# Patient Record
Sex: Female | Born: 1935 | Race: White | Hispanic: No | Marital: Married | Smoking: Never smoker
Health system: Southern US, Community
[De-identification: ages and names within clinical notes are randomized; demographics above are authoritative.]

## PROBLEM LIST (undated history)

## (undated) DIAGNOSIS — F329 Major depressive disorder, single episode, unspecified: Secondary | ICD-10-CM

## (undated) DIAGNOSIS — I480 Paroxysmal atrial fibrillation: Secondary | ICD-10-CM

## (undated) DIAGNOSIS — Z6821 Body mass index (BMI) 21.0-21.9, adult: Secondary | ICD-10-CM

## (undated) DIAGNOSIS — G988 Other disorders of nervous system: Secondary | ICD-10-CM

## (undated) DIAGNOSIS — M5136 Other intervertebral disc degeneration, lumbar region: Secondary | ICD-10-CM

## (undated) DIAGNOSIS — D509 Iron deficiency anemia, unspecified: Secondary | ICD-10-CM

## (undated) DIAGNOSIS — E039 Hypothyroidism, unspecified: Secondary | ICD-10-CM

## (undated) DIAGNOSIS — R262 Difficulty in walking, not elsewhere classified: Secondary | ICD-10-CM

## (undated) DIAGNOSIS — I029 Rheumatic chorea without heart involvement: Secondary | ICD-10-CM

## (undated) DIAGNOSIS — E538 Deficiency of other specified B group vitamins: Secondary | ICD-10-CM

## (undated) DIAGNOSIS — I5030 Unspecified diastolic (congestive) heart failure: Secondary | ICD-10-CM

## (undated) DIAGNOSIS — F418 Other specified anxiety disorders: Secondary | ICD-10-CM

## (undated) DIAGNOSIS — I619 Nontraumatic intracerebral hemorrhage, unspecified: Secondary | ICD-10-CM

## (undated) DIAGNOSIS — R0602 Shortness of breath: Secondary | ICD-10-CM

## (undated) DIAGNOSIS — K5730 Diverticulosis of large intestine without perforation or abscess without bleeding: Secondary | ICD-10-CM

## (undated) DIAGNOSIS — G43909 Migraine, unspecified, not intractable, without status migrainosus: Secondary | ICD-10-CM

## (undated) DIAGNOSIS — K219 Gastro-esophageal reflux disease without esophagitis: Secondary | ICD-10-CM

## (undated) DIAGNOSIS — M159 Polyosteoarthritis, unspecified: Secondary | ICD-10-CM

## (undated) DIAGNOSIS — K2960 Other gastritis without bleeding: Secondary | ICD-10-CM

## (undated) DIAGNOSIS — M5410 Radiculopathy, site unspecified: Secondary | ICD-10-CM

## (undated) DIAGNOSIS — R531 Weakness: Secondary | ICD-10-CM

## (undated) DIAGNOSIS — R296 Repeated falls: Secondary | ICD-10-CM

## (undated) DIAGNOSIS — R634 Abnormal weight loss: Secondary | ICD-10-CM

## (undated) DIAGNOSIS — I739 Peripheral vascular disease, unspecified: Secondary | ICD-10-CM

## (undated) DIAGNOSIS — K648 Other hemorrhoids: Secondary | ICD-10-CM

## (undated) DIAGNOSIS — R32 Unspecified urinary incontinence: Secondary | ICD-10-CM

## (undated) DIAGNOSIS — E559 Vitamin D deficiency, unspecified: Secondary | ICD-10-CM

## (undated) DIAGNOSIS — G4700 Insomnia, unspecified: Secondary | ICD-10-CM

## (undated) DIAGNOSIS — I651 Occlusion and stenosis of basilar artery: Secondary | ICD-10-CM

## (undated) DIAGNOSIS — F0150 Vascular dementia without behavioral disturbance: Secondary | ICD-10-CM

## (undated) DIAGNOSIS — F411 Generalized anxiety disorder: Secondary | ICD-10-CM

## (undated) DIAGNOSIS — K5909 Other constipation: Secondary | ICD-10-CM

## (undated) DIAGNOSIS — R1310 Dysphagia, unspecified: Secondary | ICD-10-CM

## (undated) DIAGNOSIS — H6120 Impacted cerumen, unspecified ear: Secondary | ICD-10-CM

## (undated) DIAGNOSIS — K449 Diaphragmatic hernia without obstruction or gangrene: Secondary | ICD-10-CM

## (undated) DIAGNOSIS — J45909 Unspecified asthma, uncomplicated: Secondary | ICD-10-CM

## (undated) DIAGNOSIS — J449 Chronic obstructive pulmonary disease, unspecified: Secondary | ICD-10-CM

## (undated) DIAGNOSIS — R110 Nausea: Secondary | ICD-10-CM

## (undated) DIAGNOSIS — E785 Hyperlipidemia, unspecified: Secondary | ICD-10-CM

## (undated) DIAGNOSIS — N390 Urinary tract infection, site not specified: Secondary | ICD-10-CM

## (undated) DIAGNOSIS — I4891 Unspecified atrial fibrillation: Secondary | ICD-10-CM

## (undated) DIAGNOSIS — M329 Systemic lupus erythematosus, unspecified: Secondary | ICD-10-CM

## (undated) DIAGNOSIS — K644 Residual hemorrhoidal skin tags: Secondary | ICD-10-CM

## (undated) DIAGNOSIS — J189 Pneumonia, unspecified organism: Secondary | ICD-10-CM

## (undated) HISTORY — DX: Chronic obstructive pulmonary disease, unspecified: J44.9

## (undated) HISTORY — DX: Other hemorrhoids: K64.8

## (undated) HISTORY — DX: Major depressive disorder, single episode, unspecified: F32.9

## (undated) HISTORY — DX: Unspecified asthma, uncomplicated: J45.909

## (undated) HISTORY — DX: Hyperlipidemia, unspecified: E78.5

## (undated) HISTORY — DX: Paroxysmal atrial fibrillation: I48.0

## (undated) HISTORY — DX: Occlusion and stenosis of basilar artery: I65.1

## (undated) HISTORY — DX: Impacted cerumen, unspecified ear: H61.20

## (undated) HISTORY — DX: Difficulty in walking, not elsewhere classified: R26.2

## (undated) HISTORY — DX: Diaphragmatic hernia without obstruction or gangrene: K44.9

## (undated) HISTORY — DX: Other gastritis without bleeding: K29.60

## (undated) HISTORY — DX: Gastro-esophageal reflux disease without esophagitis: K21.9

## (undated) HISTORY — DX: Nontraumatic intracerebral hemorrhage, unspecified: I61.9

## (undated) HISTORY — DX: Systemic lupus erythematosus, unspecified: M32.9

## (undated) HISTORY — DX: Unspecified atrial fibrillation: I48.91

## (undated) HISTORY — PX: APPENDECTOMY: SHX54

## (undated) HISTORY — DX: Hypothyroidism, unspecified: E03.9

## (undated) HISTORY — DX: Generalized anxiety disorder: F41.1

## (undated) HISTORY — DX: Nausea: R11.0

## (undated) HISTORY — DX: Residual hemorrhoidal skin tags: K64.4

## (undated) HISTORY — DX: Repeated falls: R29.6

## (undated) HISTORY — PX: ABDOMINAL HYSTERECTOMY: SHX81

## (undated) HISTORY — DX: Deficiency of other specified B group vitamins: E53.8

## (undated) HISTORY — DX: Unspecified urinary incontinence: R32

## (undated) HISTORY — DX: Vitamin D deficiency, unspecified: E55.9

## (undated) HISTORY — DX: Other intervertebral disc degeneration, lumbar region: M51.36

## (undated) HISTORY — DX: Other specified anxiety disorders: F41.8

## (undated) HISTORY — DX: Other disorders of nervous system: G98.8

## (undated) HISTORY — DX: Urinary tract infection, site not specified: N39.0

## (undated) HISTORY — DX: Unspecified diastolic (congestive) heart failure: I50.30

## (undated) HISTORY — DX: Peripheral vascular disease, unspecified: I73.9

## (undated) HISTORY — DX: Diverticulosis of large intestine without perforation or abscess without bleeding: K57.30

## (undated) HISTORY — DX: Insomnia, unspecified: G47.00

## (undated) HISTORY — DX: Migraine, unspecified, not intractable, without status migrainosus: G43.909

## (undated) HISTORY — DX: Dysphagia, unspecified: R13.10

## (undated) HISTORY — DX: Shortness of breath: R06.02

## (undated) HISTORY — DX: Pneumonia, unspecified organism: J18.9

## (undated) HISTORY — DX: Iron deficiency anemia, unspecified: D50.9

## (undated) HISTORY — DX: Body mass index (BMI) 21.0-21.9, adult: Z68.21

## (undated) HISTORY — DX: Other constipation: K59.09

## (undated) HISTORY — PX: BACK SURGERY: SHX140

## (undated) HISTORY — DX: Rheumatic chorea without heart involvement: I02.9

## (undated) HISTORY — DX: Radiculopathy, site unspecified: M54.10

## (undated) HISTORY — DX: Weakness: R53.1

## (undated) HISTORY — DX: Vascular dementia, unspecified severity, without behavioral disturbance, psychotic disturbance, mood disturbance, and anxiety: F01.50

## (undated) HISTORY — DX: Abnormal weight loss: R63.4

## (undated) HISTORY — DX: Polyosteoarthritis, unspecified: M15.9

---

## 2000-10-15 ENCOUNTER — Ambulatory Visit (HOSPITAL_COMMUNITY): Admission: RE | Admit: 2000-10-15 | Discharge: 2000-10-15 | Payer: Self-pay | Admitting: Specialist

## 2015-12-30 DIAGNOSIS — G44021 Chronic cluster headache, intractable: Secondary | ICD-10-CM | POA: Insufficient documentation

## 2015-12-30 HISTORY — DX: Chronic cluster headache, intractable: G44.021

## 2016-01-27 DIAGNOSIS — Z09 Encounter for follow-up examination after completed treatment for conditions other than malignant neoplasm: Secondary | ICD-10-CM

## 2016-01-27 HISTORY — DX: Encounter for follow-up examination after completed treatment for conditions other than malignant neoplasm: Z09

## 2017-04-14 DIAGNOSIS — I509 Heart failure, unspecified: Secondary | ICD-10-CM | POA: Diagnosis not present

## 2017-04-14 DIAGNOSIS — R51 Headache: Secondary | ICD-10-CM | POA: Diagnosis not present

## 2017-04-14 DIAGNOSIS — J811 Chronic pulmonary edema: Secondary | ICD-10-CM

## 2017-04-14 DIAGNOSIS — I160 Hypertensive urgency: Secondary | ICD-10-CM | POA: Diagnosis not present

## 2017-04-14 DIAGNOSIS — R443 Hallucinations, unspecified: Secondary | ICD-10-CM

## 2017-04-15 DIAGNOSIS — I609 Nontraumatic subarachnoid hemorrhage, unspecified: Secondary | ICD-10-CM

## 2017-04-15 DIAGNOSIS — R443 Hallucinations, unspecified: Secondary | ICD-10-CM | POA: Diagnosis not present

## 2017-04-15 DIAGNOSIS — R51 Headache: Secondary | ICD-10-CM | POA: Diagnosis not present

## 2017-04-15 DIAGNOSIS — J811 Chronic pulmonary edema: Secondary | ICD-10-CM | POA: Diagnosis not present

## 2017-04-15 DIAGNOSIS — I509 Heart failure, unspecified: Secondary | ICD-10-CM | POA: Diagnosis not present

## 2017-04-15 HISTORY — DX: Nontraumatic subarachnoid hemorrhage, unspecified: I60.9

## 2019-01-09 ENCOUNTER — Other Ambulatory Visit (HOSPITAL_COMMUNITY): Payer: Self-pay | Admitting: Interventional Radiology

## 2019-01-09 DIAGNOSIS — I739 Peripheral vascular disease, unspecified: Secondary | ICD-10-CM

## 2019-01-14 ENCOUNTER — Other Ambulatory Visit: Payer: Self-pay | Admitting: Student

## 2019-01-15 ENCOUNTER — Other Ambulatory Visit (HOSPITAL_COMMUNITY): Payer: Self-pay | Admitting: Interventional Radiology

## 2019-01-15 ENCOUNTER — Other Ambulatory Visit: Payer: Self-pay

## 2019-01-15 ENCOUNTER — Ambulatory Visit (HOSPITAL_COMMUNITY)
Admission: RE | Admit: 2019-01-15 | Discharge: 2019-01-15 | Disposition: A | Discharge status: Home / Self Care | Payer: Medicare (Managed Care) | Source: Ambulatory Visit | Attending: Interventional Radiology | Admitting: Interventional Radiology

## 2019-01-15 ENCOUNTER — Encounter (HOSPITAL_COMMUNITY): Payer: Self-pay

## 2019-01-15 DIAGNOSIS — I509 Heart failure, unspecified: Secondary | ICD-10-CM | POA: Insufficient documentation

## 2019-01-15 DIAGNOSIS — I130 Hypertensive heart and chronic kidney disease with heart failure and stage 1 through stage 4 chronic kidney disease, or unspecified chronic kidney disease: Secondary | ICD-10-CM | POA: Insufficient documentation

## 2019-01-15 DIAGNOSIS — I739 Peripheral vascular disease, unspecified: Secondary | ICD-10-CM

## 2019-01-15 DIAGNOSIS — M329 Systemic lupus erythematosus, unspecified: Secondary | ICD-10-CM | POA: Diagnosis not present

## 2019-01-15 DIAGNOSIS — J449 Chronic obstructive pulmonary disease, unspecified: Secondary | ICD-10-CM | POA: Diagnosis not present

## 2019-01-15 DIAGNOSIS — I745 Embolism and thrombosis of iliac artery: Secondary | ICD-10-CM | POA: Insufficient documentation

## 2019-01-15 DIAGNOSIS — I70221 Atherosclerosis of native arteries of extremities with rest pain, right leg: Secondary | ICD-10-CM | POA: Insufficient documentation

## 2019-01-15 DIAGNOSIS — I2510 Atherosclerotic heart disease of native coronary artery without angina pectoris: Secondary | ICD-10-CM | POA: Insufficient documentation

## 2019-01-15 DIAGNOSIS — I480 Paroxysmal atrial fibrillation: Secondary | ICD-10-CM | POA: Insufficient documentation

## 2019-01-15 DIAGNOSIS — N183 Chronic kidney disease, stage 3 (moderate): Secondary | ICD-10-CM | POA: Insufficient documentation

## 2019-01-15 DIAGNOSIS — Z8673 Personal history of transient ischemic attack (TIA), and cerebral infarction without residual deficits: Secondary | ICD-10-CM | POA: Diagnosis not present

## 2019-01-15 HISTORY — PX: IR ANGIOGRAM EXTREMITY BILATERAL: IMG653

## 2019-01-15 HISTORY — PX: IR ILIAC ART STENT INC PTA MOD SED: IMG2306

## 2019-01-15 HISTORY — PX: IR US GUIDE VASC ACCESS LEFT: IMG2389

## 2019-01-15 HISTORY — PX: IR US GUIDE VASC ACCESS RIGHT: IMG2390

## 2019-01-15 HISTORY — PX: IR AORTAGRAM ABDOMINAL SERIALOGRAM: IMG636

## 2019-01-15 HISTORY — PX: IR TRANSCATH PLC STENT 1ST ART NOT LE CV CAR VERT CAR: IMG5443

## 2019-01-15 LAB — CBC
HCT: 34.2 % — ABNORMAL LOW (ref 36.0–46.0)
Hemoglobin: 10.7 g/dL — ABNORMAL LOW (ref 12.0–15.0)
MCH: 27.6 pg (ref 26.0–34.0)
MCHC: 31.3 g/dL (ref 30.0–36.0)
MCV: 88.1 fL (ref 80.0–100.0)
Platelets: 156 10*3/uL (ref 150–400)
RBC: 3.88 MIL/uL (ref 3.87–5.11)
RDW: 12 % (ref 11.5–15.5)
WBC: 4 10*3/uL (ref 4.0–10.5)
nRBC: 0 % (ref 0.0–0.2)

## 2019-01-15 LAB — POCT I-STAT 4, (NA,K, GLUC, HGB,HCT)
Glucose, Bld: 90 mg/dL (ref 70–99)
HCT: 31 % — ABNORMAL LOW (ref 36.0–46.0)
Hemoglobin: 10.5 g/dL — ABNORMAL LOW (ref 12.0–15.0)
Potassium: 4 mmol/L (ref 3.5–5.1)
Sodium: 135 mmol/L (ref 135–145)

## 2019-01-15 LAB — APTT: aPTT: 29 seconds (ref 24–36)

## 2019-01-15 LAB — PROTIME-INR
INR: 1 (ref 0.8–1.2)
Prothrombin Time: 13.3 seconds (ref 11.4–15.2)

## 2019-01-15 LAB — POCT I-STAT CREATININE: CREATININE: 1 mg/dL (ref 0.44–1.00)

## 2019-01-15 MED ORDER — CLOPIDOGREL BISULFATE 75 MG PO TABS
ORAL_TABLET | ORAL | Status: AC
  Administered 2019-01-15: 300 mg via ORAL
  Filled 2019-01-15: qty 4

## 2019-01-15 MED ORDER — MIDAZOLAM HCL 2 MG/2ML IJ SOLN
INTRAMUSCULAR | Status: AC | PRN
  Administered 2019-01-15: 1 mg via INTRAVENOUS
  Administered 2019-01-15 (×3): 0.5 mg via INTRAVENOUS

## 2019-01-15 MED ORDER — CEFAZOLIN SODIUM-DEXTROSE 2-4 GM/100ML-% IV SOLN
2.0000 g | Freq: Once | INTRAVENOUS | Status: AC
  Administered 2019-01-15: 2 g via INTRAVENOUS

## 2019-01-15 MED ORDER — HEPARIN SODIUM (PORCINE) 1000 UNIT/ML IJ SOLN
INTRAMUSCULAR | Status: AC
  Filled 2019-01-15: qty 1

## 2019-01-15 MED ORDER — MIDAZOLAM HCL 2 MG/2ML IJ SOLN
INTRAMUSCULAR | Status: AC
  Filled 2019-01-15: qty 4

## 2019-01-15 MED ORDER — FENTANYL CITRATE (PF) 100 MCG/2ML IJ SOLN
INTRAMUSCULAR | Status: AC
  Filled 2019-01-15: qty 4

## 2019-01-15 MED ORDER — LIDOCAINE HCL 1 % IJ SOLN
INTRAMUSCULAR | Status: AC
  Filled 2019-01-15: qty 20

## 2019-01-15 MED ORDER — IOHEXOL 300 MG/ML  SOLN
150.0000 mL | Freq: Once | INTRAMUSCULAR | Status: AC | PRN
  Administered 2019-01-15: 75 mL via INTRAVENOUS

## 2019-01-15 MED ORDER — CEFAZOLIN SODIUM-DEXTROSE 2-4 GM/100ML-% IV SOLN
INTRAVENOUS | Status: AC
  Filled 2019-01-15: qty 100

## 2019-01-15 MED ORDER — CLOPIDOGREL BISULFATE 75 MG PO TABS: 300.0000 mg | ORAL_TABLET | Freq: Every day | ORAL | Status: DC

## 2019-01-15 MED ORDER — LIDOCAINE HCL 1 % IJ SOLN
INTRAMUSCULAR | Status: AC | PRN
  Administered 2019-01-15: 5 mL

## 2019-01-15 MED ORDER — FENTANYL CITRATE (PF) 100 MCG/2ML IJ SOLN
INTRAMUSCULAR | Status: AC | PRN
  Administered 2019-01-15: 25 ug via INTRAVENOUS
  Administered 2019-01-15: 50 ug via INTRAVENOUS
  Administered 2019-01-15 (×2): 25 ug via INTRAVENOUS

## 2019-01-15 MED ORDER — HEPARIN SODIUM (PORCINE) 1000 UNIT/ML IJ SOLN
INTRAMUSCULAR | Status: AC | PRN
  Administered 2019-01-15: 3000 [IU] via INTRAVENOUS

## 2019-01-15 MED ORDER — CLOPIDOGREL BISULFATE 75 MG PO TABS: 75.0000 mg | ORAL_TABLET | Freq: Every day | ORAL | 3 refills | Status: DC

## 2019-01-15 MED ORDER — CLOPIDOGREL BISULFATE 75 MG PO TABS
300.0000 mg | ORAL_TABLET | Freq: Every day | ORAL | Status: DC
  Administered 2019-01-15: 300 mg via ORAL

## 2019-01-15 MED ORDER — IOHEXOL 300 MG/ML  SOLN
150.0000 mL | Freq: Once | INTRAMUSCULAR | Status: AC | PRN
  Administered 2019-01-15: 15 mL via INTRAVENOUS

## 2019-01-15 MED ORDER — SODIUM CHLORIDE 0.9 % IV SOLN: INTRAVENOUS | Status: DC

## 2019-01-15 NOTE — Sedation Documentation (Signed)
Discussed allergies with the patient and family.  The patient has an allergy to morphine and derivitives.  Demerol makes her nauseous as well codeine.  Pt states she does not have an allergy to fentanyl, pt's daughter confirmed that.

## 2019-01-15 NOTE — Procedures (Signed)
Interventional Radiology Procedure Note  Procedure: Aortoiliac reconstruction, endovascular  Vascular Access:  Right CFA 25F --> 45F AngioSeal; Left CFA 39F --> 39F Angioseal   Complications: None  Estimated Blood Loss: None  Recommendations: - Load with 300 mg Plavix - Bedrest with legs straight x 4 hrs - DC home - Will f/u via telephone  Signed,  Criselda Peaches, MD

## 2019-01-15 NOTE — Progress Notes (Signed)
No bleeding or hematoma noted after procedure

## 2019-01-15 NOTE — H&P (Signed)
Chief Complaint: Patient was seen in consultation today for bilateral lower extremity angiogram with possible intervention  Referring Physician(s): Mamie Nick NP  Supervising Physician: Jacqulynn Cadet  Patient Status: [facility] - Out-pt  History of Present Illness: Jennifer Osborne is a 85 y.o. female with a past medical history of anxiety, depression, vascular dementia, migraines, SLE, hemorrhagic stroke, anemia, COPD, basilar artery stenosis, CKD III, CAD, CHF, HTN, paroxysmal a.fib not on anticoagulation and peripheral artery disease who presents today for bilateral lower extremity angiogram with possible intervention. She was seen in consult by Dr. Laurence Ferrari for progressive right lower extremity pain that is not helped with OTC pain medication and has disrupted her sleep as well as ambulation. During that visit the decision was made to pursue endovascular therapy for her PAD for which she presents today.   Patient presents with her daughter today. She reports ongoing pain in both of her legs which is not worsened by walking or relieved by rest, she states "I don't even know if anything makes it better or worse anymore, it's so bad." She also endorses some swelling in her lower extremities at times. She denies any chest pain, dyspnea at rest or open wounds to her feet/lower extremities. She reports that she normally walks with a walker at home and has had no recent falls. She dose endorse chronic dry cough which is baseline for her COPD.    Allergies: Patient has no allergy information on record.  Medications: Prior to Admission medications   Not on File     No family history on file.  Social History   Socioeconomic History  . Marital status: Married    Spouse name: Not on file  . Number of children: Not on file  . Years of education: Not on file  . Highest education level: Not on file  Occupational History  . Not on file  Social Needs  . Financial resource strain: Not on  file  . Food insecurity:    Worry: Not on file    Inability: Not on file  . Transportation needs:    Medical: Not on file    Non-medical: Not on file  Tobacco Use  . Smoking status: Not on file  Substance and Sexual Activity  . Alcohol use: Not on file  . Drug use: Not on file  . Sexual activity: Not on file  Lifestyle  . Physical activity:    Days per week: Not on file    Minutes per session: Not on file  . Stress: Not on file  Relationships  . Social connections:    Talks on phone: Not on file    Gets together: Not on file    Attends religious service: Not on file    Active member of club or organization: Not on file    Attends meetings of clubs or organizations: Not on file    Relationship status: Not on file  Other Topics Concern  . Not on file  Social History Narrative  . Not on file     Review of Systems: A 12 point ROS discussed and pertinent positives are indicated in the HPI above.  All other systems are negative.  Review of Systems  Constitutional: Negative for appetite change, chills, fever and unexpected weight change.  Respiratory: Positive for cough (dry, chronic - baseline COPD) and shortness of breath (sometimes with exertion; none at rest).   Cardiovascular: Positive for leg swelling. Negative for chest pain.  Gastrointestinal: Negative for abdominal pain, diarrhea,  nausea and vomiting.  Musculoskeletal: Negative for back pain.       (+) BLE pain at rest and with ambulation  Skin: Negative for color change and wound.  Neurological: Negative for dizziness, syncope and headaches.  Psychiatric/Behavioral: Negative for confusion.    Vital Signs: BP (!) 160/59   Pulse (!) 59   Temp 97.9 F (36.6 C) (Skin)   Resp 18   Ht 5\' 4"  (1.626 m)   Wt 123 lb (55.8 kg)   SpO2 99%   BMI 21.11 kg/m   Physical Exam Vitals signs reviewed.  Constitutional:      General: She is not in acute distress.    Comments: Daughter at bedside during exam.    Cardiovascular:     Rate and Rhythm: Normal rate and regular rhythm.  Pulmonary:     Effort: Pulmonary effort is normal.     Breath sounds: Normal breath sounds.  Abdominal:     General: There is no distension.     Palpations: Abdomen is soft.     Tenderness: There is no abdominal tenderness.  Musculoskeletal:     Right lower leg: No edema.     Left lower leg: No edema.  Skin:    General: Skin is warm and dry.     Findings: No lesion.  Neurological:     Mental Status: She is alert and oriented to person, place, and time.  Psychiatric:        Mood and Affect: Mood normal.        Behavior: Behavior normal.        Thought Content: Thought content normal.        Judgment: Judgment normal.      MD Evaluation Airway: WNL(Top and bottom dentures) Heart: WNL Abdomen: WNL Chest/ Lungs: WNL ASA  Classification: 3 Mallampati/Airway Score: Two   Imaging: No results found.  Labs:  CBC: Recent Labs    01/15/19 0944  WBC 4.0  HGB 10.7*  HCT 34.2*  PLT 156    COAGS: No results for input(s): INR, APTT in the last 8760 hours.  BMP: No results for input(s): NA, K, CL, CO2, GLUCOSE, BUN, CALCIUM, CREATININE, GFRNONAA, GFRAA in the last 8760 hours.  Invalid input(s): CMP  LIVER FUNCTION TESTS: No results for input(s): BILITOT, AST, ALT, ALKPHOS, PROT, ALBUMIN in the last 8760 hours.  TUMOR MARKERS: No results for input(s): AFPTM, CEA, CA199, CHROMGRNA in the last 8760 hours.  Assessment and Plan:  85 y/o F with history of PAD who was seen in consultation by Dr. Laurence Ferrari for progressive right lower extremity pain. During that visit decision was made to proceed with angiogram and possible intervention for which she presents today.  Patient has been NPO since 8 pm yesterday, she does not take any blood thinning medications, she took her BP and thyroid medications this morning with a sip of water. Afebrile, WBC 4.0, hgb 10.7, plt 156, INR pending at time of this note  writing.  Risks and benefits of lower extremity angiogram with possible intervention were discussed with the patient including, but not limited to bleeding, infection, vascular injury, contrast induced renal failure, stroke or even death.  This interventional procedure involves the use of X-rays and because of the nature of the planned procedure, it is possible that we will have prolonged use of X-ray fluoroscopy.  Potential radiation risks to you include (but are not limited to) the following: - A slightly elevated risk for cancer  several years later in life.  This risk is typically less than 0.5% percent. This risk is low in comparison to the normal incidence of human cancer, which is 33% for women and 50% for men according to the [employer]. - Radiation induced injury can include skin redness, resembling a rash, tissue breakdown / ulcers and hair loss (which can be temporary or permanent).  The likelihood of either of these occurring depends on the difficulty of the procedure and whether you are sensitive to radiation due to previous procedures, disease, or genetic conditions.  IF your procedure requires a prolonged use of radiation, you will be notified and given written instructions for further action.  It is your responsibility to monitor the irradiated area for the 2 weeks following the procedure and to notify your physician if you are concerned that you have suffered a radiation induced injury.    All of the patient's questions were answered, patient is agreeable to proceed.  Consent signed and in chart.  Thank you for this interesting consult.  I greatly enjoyed meeting Jennifer Osborne and look forward to participating in their care.  A copy of this report was sent to the requesting provider on this date.  Electronically Signed: Joaquim Nam, PA-C 01/15/2019, 9:39 AM   I spent a total of   25 Minutes in face to face in clinical consultation, greater than 50% of which  was counseling/coordinating care for lower extremity angiogram with possible intervention.

## 2019-01-15 NOTE — Sedation Documentation (Addendum)
Pt in IR room 1.  Placed supine on table.  Cardiac monitor attached.  Pt placed on 2L O2 via [place] for procedure

## 2019-01-20 ENCOUNTER — Other Ambulatory Visit (HOSPITAL_COMMUNITY): Payer: Self-pay | Admitting: Interventional Radiology

## 2019-01-20 ENCOUNTER — Encounter (HOSPITAL_COMMUNITY): Payer: Self-pay

## 2019-01-20 DIAGNOSIS — I739 Peripheral vascular disease, unspecified: Secondary | ICD-10-CM

## 2019-02-19 ENCOUNTER — Other Ambulatory Visit (HOSPITAL_COMMUNITY): Payer: Self-pay | Admitting: Interventional Radiology

## 2019-02-19 ENCOUNTER — Encounter (HOSPITAL_COMMUNITY): Payer: Self-pay

## 2019-02-19 DIAGNOSIS — I739 Peripheral vascular disease, unspecified: Secondary | ICD-10-CM

## 2019-02-19 NOTE — Addendum Note (Signed)
Encounter addended by: Riley Churches on: 02/19/2019 7:49 AM  Actions taken: Imaging Exam ended, Charge Capture section accepted

## 2019-02-19 NOTE — Addendum Note (Signed)
Encounter addended by: Riley Churches on: 02/19/2019 9:37 AM  Actions taken: Order list changed, Imaging Exam ended

## 2019-07-15 ENCOUNTER — Encounter

## 2019-07-15 ENCOUNTER — Ambulatory Visit (INDEPENDENT_AMBULATORY_CARE_PROVIDER_SITE_OTHER): Payer: No Typology Code available for payment source | Admitting: Neurology

## 2019-07-15 ENCOUNTER — Other Ambulatory Visit: Payer: Self-pay

## 2019-07-15 ENCOUNTER — Encounter: Payer: Self-pay | Admitting: Neurology

## 2019-07-15 VITALS — BP 111/62 | HR 95 | Temp 98.1°F | Ht 64.0 in | Wt 123.0 lb

## 2019-07-15 DIAGNOSIS — R259 Unspecified abnormal involuntary movements: Secondary | ICD-10-CM

## 2019-07-15 DIAGNOSIS — G629 Polyneuropathy, unspecified: Secondary | ICD-10-CM | POA: Insufficient documentation

## 2019-07-15 DIAGNOSIS — R269 Unspecified abnormalities of gait and mobility: Secondary | ICD-10-CM

## 2019-07-15 DIAGNOSIS — G6289 Other specified polyneuropathies: Secondary | ICD-10-CM | POA: Diagnosis not present

## 2019-07-15 HISTORY — DX: Unspecified abnormal involuntary movements: R25.9

## 2019-07-15 HISTORY — DX: Unspecified abnormalities of gait and mobility: R26.9

## 2019-07-15 NOTE — Progress Notes (Addendum)
PATIENT: Jennifer Osborne DOB: June 20, 1931  Chief Complaint  Patient presents with  . Frequent falls, leg weakness    She is here with her son-in-law, [name].  Reports weakness and painful jerks in her bilateral legs that are causing her to fall.   . Hallucinations    Reports brain bleed last year that caused hallucinations.  States the problem has now resolved.   Marland Kitchen PCP    [name], NP     HISTORICAL  Jennifer Osborne is a 85 year old female, seen in request by her primary care nurse practitioner [name], for evaluation of frequent falls, leg weakness, hallucinations, she is accompanied by his son-in-law [name] at today's visit on July 15 2019.  I have reviewed and summarized the referring note from the referring physician.  She has past medical history of hypothyroidism, on supplement, hypertension, hyperlipidemia, basilar artery stenosis, history of subarachnoid hemorrhage, chronic kidney disease, seasonal atrial fibrillation, vitamin B12 deficiency,  She currently lives with her daughter [name] since 2018, around that time, she was noted to have gradual onset gait abnormality, in a sitting down position, she often has left leg uncontrollable jumping, lasting for few minutes, can be helped by putting right leg on top of left leg, occasionally involvement of right leg, when she gets up bearing weight, sometimes bilateral lower extremity large amplitude tremor before she can take off, in recent few weeks, she also complains of urinary urgency, occur occasionally incontinence.  She denies significant neck pain, mild bilateral lower extremity numbness, no bilateral hands paresthesia or weakness, she uses cane at home, unsteady gait, with occasionally fall, he is receiving home physical therapy  She was given Mirapex 1 mg twice a day without significant improvement  She was also noted to have gradual onset mild memory loss, but still able to attend her daily personal needs, such as  dressing, toileting, eating, simple cooking,   I also reviewed EMG nerve conduction study report on May 20, 2019 by Dr. Lenox Ahr, bilateral superficial peroneal sensory responses were absent.  Bilateral sural sensory response showed mildly decreased snap amplitude, with mildly prolonged peak latency.  Bilateral peroneal to EDB motor responses were absent.  To bilateral tibialis anterior motor responses were present, with normal conduction velocity.  Bilateral tibial motor responses showed mildly decreased conduction velocity  Lateral tibial H reflex were present.  Selected needle examination of bilateral lower extremity muscles showed mildly decreased recruitment patterns at bilateral extensor hallucis longus.  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   REVIEW OF SYSTEMS: Full 14 system review of systems performed and notable only for as above All other review of systems were negative.  ALLERGIES: Allergies  Allergen Reactions  . Morphine And Related Itching    Pt states she had itching, no rash  . Meperidine Nausea Only  . Pregabalin     Caused stroke    HOME MEDICATIONS: Current Outpatient Medications  Medication Sig Dispense Refill  . aspirin EC 81 MG tablet Take 81 mg by mouth daily.    . Calcium Carb-Cholecalciferol (CALCIUM EXTRA D3) 500-600 MG-UNIT TABS Take 1 tablet by mouth 2 (two) times daily.    . carvedilol (COREG) 3.125 MG tablet Take 3.125 mg by mouth 2 (two) times daily with a meal.    . DULoxetine (CYMBALTA) 30 MG capsule Take 30 mg by mouth daily. Take along with 60mg  for a total daily dose of 90mg  daily.    . DULoxetine (CYMBALTA) 60 MG capsule Take  60 mg by mouth daily. Take along with 30mg  for a total daily dose of 90mg  daily.    . ferrous sulfate 325 (65 FE) MG tablet Take 325 mg by mouth every evening.    Marland Kitchen levothyroxine (SYNTHROID) 25 MCG tablet Take 37.5 mcg by mouth daily before breakfast.    . lisinopril (PRINIVIL,ZESTRIL) 2.5 MG tablet Take  2.5 mg by mouth daily.    . mirtazapine (REMERON) 7.5 MG tablet Take 7.5 mg by mouth at bedtime.    . Multiple Vitamin (MULTIVITAMIN) tablet Take 1 tablet by mouth daily.    . pantoprazole (PROTONIX) 40 MG tablet Take 40 mg by mouth daily.     . pramipexole (MIRAPEX) 1 MG tablet Take 1 mg by mouth 2 (two) times daily.    . rosuvastatin (CRESTOR) 10 MG tablet Take 10 mg by mouth daily.    Marland Kitchen spironolactone (ALDACTONE) 25 MG tablet Take 25 mg by mouth daily.     No current facility-administered medications for this visit.     PAST MEDICAL HISTORY: Past Medical History:  Diagnosis Date  . Atrial fibrillation ([place])   . Brain bleed ([place])    history in 2019  . Depression with anxiety   . Frequent falls   . Hyperlipemia   . Migraine   . Weakness     PAST SURGICAL HISTORY: Past Surgical History:  Procedure Laterality Date  . ABDOMINAL HYSTERECTOMY    . APPENDECTOMY    . BACK SURGERY    . IR ANGIOGRAM EXTREMITY BILATERAL  01/15/2019  . IR AORTAGRAM ABDOMINAL SERIALOGRAM  01/15/2019  . IR ILIAC ART STENT INC PTA MOD SED  01/15/2019  . IR TRANSCATH PLC STENT 1ST ART NOT LE CV CAR VERT CAR  01/15/2019  . IR US GUIDE VASC ACCESS LEFT  01/15/2019  . IR US GUIDE VASC ACCESS RIGHT  01/15/2019    FAMILY HISTORY: Family History  Problem Relation Age of Onset  . Heart disease Mother   . Heart attack Father     SOCIAL HISTORY: Social History   Socioeconomic History  . Marital status: Married    Spouse name: Not on file  . Number of children: 5  . Years of education: 50  . Highest education level: High school graduate  Occupational History  . Occupation: Retired  Scientific laboratory technician  . Financial resource strain: Not on file  . Food insecurity    Worry: Not on file    Inability: Not on file  . Transportation needs    Medical: Not on file    Non-medical: Not on file  Tobacco Use  . Smoking status: Never Smoker  . Smokeless tobacco: Never Used  Substance and Sexual Activity  . Alcohol  use: Never    Frequency: Never  . Drug use: Never  . Sexual activity: Not on file  Lifestyle  . Physical activity    Days per week: Not on file    Minutes per session: Not on file  . Stress: Not on file  Relationships  . Social Herbalist on phone: Not on file    Gets together: Not on file    Attends religious service: Not on file    Active member of club or organization: Not on file    Attends meetings of clubs or organizations: Not on file    Relationship status: Not on file  . Intimate partner violence    Fear of current or ex partner: Not on file  Emotionally abused: Not on file    Physically abused: Not on file    Forced sexual activity: Not on file  Other Topics Concern  . Not on file  Social History Narrative   Lives at home with daughter.   Right-handed.   3-4 cups caffeine per day (Coke).     PHYSICAL EXAM   Vitals:   07/15/19 1522  BP: 111/62  Pulse: 95  Temp: 98.1 F (36.7 C)  Weight: 123 lb (55.8 kg)  Height: 5\' 4"  (1.626 m)    Not recorded      Body mass index is 21.11 kg/m.  PHYSICAL EXAMNIATION:  Gen: NAD, conversant, well nourised, well groomed                     Cardiovascular: Regular rate rhythm, no peripheral edema, warm, nontender. Eyes: Conjunctivae clear without exudates or hemorrhage Neck: Supple, no carotid bruits. Pulmonary: Clear to auscultation bilaterally   NEUROLOGICAL EXAM:  MENTAL STATUS: Speech:    Speech is normal; fluent and spontaneous with normal comprehension.  Cognition:     Orientation to time, place and person     Normal recent and remote memory     Normal Attention span and concentration     Normal Language, naming, repeating,spontaneous speech     Fund of knowledge   CRANIAL NERVES: CN II: Visual fields are full to confrontation.  Pupils are round equal and briskly reactive to light. CN III, IV, VI: extraocular movement are normal. No ptosis. CN V: Facial sensation is intact to pinprick in  all 3 divisions bilaterally. Corneal responses are intact.  CN VII: Face is symmetric with normal eye closure and smile. CN VIII: Hearing is normal to causal conversation. CN IX, X: Palate elevates symmetrically. Phonation is normal. CN XI: Head turning and shoulder shrug are intact CN XII: Tongue is midline with normal movements and no atrophy.  MOTOR: There is no pronator drift of out-stretched arms. Muscle bulk and tone are normal. Muscle strength is normal.  REFLEXES: Reflexes are 3 and symmetric at the biceps, triceps, knees, and absent at ankles. Plantar responses are extensor bilaterally.  SENSORY: Length dependent decreased to light touch, pinprick and vibratory sensation to bilateral ankle levels.  COORDINATION: Rapid alternating movements and fine finger movements are intact. There is no dysmetria on finger-to-nose and heel-knee-shin.    GAIT/STANCE: She needs push-up to get up from seated position, large amplitude bilateral lower extremity tremor, cautious,  unsteady Romberg is absent.   DIAGNOSTIC DATA (LABS, IMAGING, TESTING) - I reviewed patient records, labs, notes, testing and imaging myself where available.   ASSESSMENT AND PLAN  DAMAYA KEIM is a 85 y.o. female   Gait abnormality Left lower extremity involuntary jumpy movement, suggestive of mild myoclonic activities  Proceed with MRI of cervical spine to rule out cervical spondylitic myelopathy  MRI of brain for evaluation of memory loss and frequent falling  Laboratory evaluations  Stop Mirapex   Marcial Pacas, M.D. Ph.D.  [facility] [address], [place], [place] [address] Ph: [phone] Fax: [phone]  CC: [name], NP

## 2019-07-16 ENCOUNTER — Telehealth: Payer: Self-pay | Admitting: Neurology

## 2019-07-16 NOTE — Telephone Encounter (Signed)
[facility] care order sent to GI. No auth they will reach out to the patient to schedule.

## 2019-07-17 ENCOUNTER — Telehealth: Payer: Self-pay | Admitting: Neurology

## 2019-07-17 LAB — COMPREHENSIVE METABOLIC PANEL
ALT: 15 IU/L (ref 0–32)
AST: 20 IU/L (ref 0–40)
Albumin/Globulin Ratio: 1.6 (ref 1.2–2.2)
Albumin: 4.1 g/dL (ref 3.6–4.6)
Alkaline Phosphatase: 67 IU/L (ref 39–117)
BUN/Creatinine Ratio: 15 (ref 12–28)
BUN: 34 mg/dL — ABNORMAL HIGH (ref 8–27)
Bilirubin Total: 0.4 mg/dL (ref 0.0–1.2)
CO2: 17 mmol/L — ABNORMAL LOW (ref 20–29)
Calcium: 10 mg/dL (ref 8.7–10.3)
Chloride: 103 mmol/L (ref 96–106)
Creatinine, Ser: 2.24 mg/dL — ABNORMAL HIGH (ref 0.57–1.00)
GFR calc Af Amer: 22 mL/min/{1.73_m2} — ABNORMAL LOW (ref >59)
GFR calc non Af Amer: 19 mL/min/{1.73_m2} — ABNORMAL LOW (ref >59)
Globulin, Total: 2.6 g/dL (ref 1.5–4.5)
Glucose: 104 mg/dL — ABNORMAL HIGH (ref 65–99)
Potassium: 4.3 mmol/L (ref 3.5–5.2)
Sodium: 135 mmol/L (ref 134–144)
Total Protein: 6.7 g/dL (ref 6.0–8.5)

## 2019-07-17 LAB — CBC WITH DIFFERENTIAL
Basophils Absolute: 0 10*3/uL (ref 0.0–0.2)
Basos: 0 %
EOS (ABSOLUTE): 0.1 10*3/uL (ref 0.0–0.4)
Eos: 1 %
Hematocrit: 36.3 % (ref 34.0–46.6)
Hemoglobin: 12.2 g/dL (ref 11.1–15.9)
Immature Grans (Abs): 0 10*3/uL (ref 0.0–0.1)
Immature Granulocytes: 0 %
Lymphocytes Absolute: 2 10*3/uL (ref 0.7–3.1)
Lymphs: 31 %
MCH: 28.4 pg (ref 26.6–33.0)
MCHC: 33.6 g/dL (ref 31.5–35.7)
MCV: 85 fL (ref 79–97)
Monocytes Absolute: 0.5 10*3/uL (ref 0.1–0.9)
Monocytes: 7 %
Neutrophils Absolute: 3.9 10*3/uL (ref 1.4–7.0)
Neutrophils: 61 %
RBC: 4.29 x10E6/uL (ref 3.77–5.28)
RDW: 14.2 % (ref 11.7–15.4)
WBC: 6.5 10*3/uL (ref 3.4–10.8)

## 2019-07-17 LAB — VITAMIN B12: Vitamin B-12: 544 pg/mL (ref 232–1245)

## 2019-07-17 LAB — VITAMIN D 25 HYDROXY (VIT D DEFICIENCY, FRACTURES): Vit D, 25-Hydroxy: 44.9 ng/mL (ref 30.0–100.0)

## 2019-07-17 LAB — CK: Total CK: 51 U/L (ref 26–161)

## 2019-07-17 LAB — COPPER, SERUM: Copper: 121 ug/dL (ref 72–166)

## 2019-07-17 LAB — TSH: TSH: 0.705 u[IU]/mL (ref 0.450–4.500)

## 2019-07-17 LAB — FERRITIN: Ferritin: 97 ng/mL (ref 15–150)

## 2019-07-17 LAB — RPR: RPR Ser Ql: NONREACTIVE

## 2019-07-17 NOTE — Telephone Encounter (Addendum)
Please call patient, laboratory evaluation showed evidence of abnormal kidney function, creatinine of 2.24, estimated GFR of 19, from reviewing record, creatinine function was normal in June 2018 from [facility]  Worsening kidney function sometimes can cause myoclonic jerking, creatinine was normal in March 2020,     I have forwarded lab results to her primary care nurse practitioner Darci Current, she should contact primary care for further evaluation

## 2019-07-17 NOTE — Telephone Encounter (Signed)
Spoke to her daughter (on [place]) and she is aware of her worsening kidney function.  She verbalized understanding to follow up with PCP.

## 2019-08-09 ENCOUNTER — Other Ambulatory Visit: Payer: Medicare (Managed Care)

## 2019-09-10 ENCOUNTER — Telehealth: Payer: Self-pay | Admitting: Neurology

## 2019-09-10 NOTE — Telephone Encounter (Signed)
I have reviewed the report of MRI of brain without and with contrast on April 02, 2019 [facility] radiology, that was ordered by her primary care nurse practitioner [name], mild atrophy, no acute abnormality, no significant white matter disease  MRI of cervical spine without contrast August 29, 2019, degenerative cervical spondylosis with multilevel disc disease, and facet disease, progressive findings compared to previous study, shallow central disc protrusion at C3-4, mild spinal and bilateral foraminal stenosis at C5-6, bulging annulus, small central disc protrusion at C7-T1

## 2019-09-16 ENCOUNTER — Other Ambulatory Visit: Payer: Self-pay

## 2019-09-16 ENCOUNTER — Encounter: Payer: Self-pay | Admitting: Neurology

## 2019-09-16 ENCOUNTER — Ambulatory Visit (INDEPENDENT_AMBULATORY_CARE_PROVIDER_SITE_OTHER): Payer: No Typology Code available for payment source | Admitting: Neurology

## 2019-09-16 VITALS — BP 125/75 | HR 87 | Temp 97.6°F | Ht 64.0 in | Wt 122.5 lb

## 2019-09-16 DIAGNOSIS — R259 Unspecified abnormal involuntary movements: Secondary | ICD-10-CM | POA: Diagnosis not present

## 2019-09-16 DIAGNOSIS — R269 Unspecified abnormalities of gait and mobility: Secondary | ICD-10-CM

## 2019-09-16 NOTE — Progress Notes (Signed)
PATIENT: Jennifer Osborne DOB: 10/26/1931  Chief Complaint  Patient presents with  . Gait Problem/Polyneuropathy    She is here with her son-in-law, [name], to review her MRI results.      HISTORICAL  Jennifer Osborne is a 85 year old female, seen in request by her primary care nurse practitioner [name], for evaluation of frequent falls, leg weakness, hallucinations, she is accompanied by his son-in-law [name] at today's visit on July 15 2019.  I have reviewed and summarized the referring note from the referring physician.  She has past medical history of hypothyroidism, on supplement, hypertension, hyperlipidemia, basilar artery stenosis, history of subarachnoid hemorrhage, chronic kidney disease, seasonal atrial fibrillation, vitamin B12 deficiency,  She currently lives with her daughter Jennifer Osborne since 2018, around that time, she was noted to have gradual onset gait abnormality, in a sitting down position, she often has left leg uncontrollable jumping, lasting for few minutes, can be helped by putting right leg on top of left leg, occasionally involvement of right leg, when she gets up bearing weight, sometimes bilateral lower extremity large amplitude tremor before she can take off, in recent few weeks, she also complains of urinary urgency, occur occasionally incontinence.  She denies significant neck pain, mild bilateral lower extremity numbness, no bilateral hands paresthesia or weakness, she uses cane at home, unsteady gait, with occasionally fall, he is receiving home physical therapy  She was given Mirapex 1 mg twice a day without significant improvement  She was also noted to have gradual onset mild memory loss, but still able to attend her daily personal needs, such as dressing, toileting, eating, simple cooking,   I also reviewed EMG nerve conduction study report on May 20, 2019 by Dr. Lenox Ahr, bilateral superficial peroneal sensory responses were absent.  Bilateral  sural sensory response showed mildly decreased snap amplitude, with mildly prolonged peak latency.  Bilateral peroneal to EDB motor responses were absent.  To bilateral tibialis anterior motor responses were present, with normal conduction velocity.  Bilateral tibial motor responses showed mildly decreased conduction velocity  Lateral tibial H reflex were present.  Selected needle examination of bilateral lower extremity muscles showed mildly decreased recruitment patterns at bilateral extensor hallucis longus.  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  UPDATE Sep 16 2019: She completed by her son-in-law at today's visit, she complains of nause, difficult eating, unsteady on her feet, she complains of mild constipation.  I have personally reviewed MRI of brain without and with contrast on April 02, 2019 [facility] radiology, that was ordered by her primary care nurse practitioner [name], mild atrophy, no acute abnormality, no significant white matter disease  MRI of cervical spine without contrast August 29, 2019, degenerative cervical spondylosis with multilevel disc disease, and facet disease, progressive findings compared to previous study, shallow central disc protrusion at C3-4, mild spinal and bilateral foraminal stenosis at C5-6, bulging annulus, small central disc protrusion at C7-T1  Laboratory evaluations in September 2020 showed normal copper, CPK, TSH, B12, RPR, ferritin, CBC, vitamin D level, with elevated creatinine of 2.24, GFR of 19  REVIEW OF SYSTEMS: Full 14 system review of systems performed and notable only for as above All other review of systems were negative.  ALLERGIES: Allergies  Allergen Reactions  . Morphine And Related Itching    Pt states she had itching, no rash  . Meperidine Nausea Only  . Pregabalin     Caused stroke    HOME MEDICATIONS: Current Outpatient Medications  Medication  Sig Dispense Refill  . aspirin EC 81 MG tablet Take 81 mg by  mouth daily.    . Calcium Carb-Cholecalciferol (CALCIUM EXTRA D3) 500-600 MG-UNIT TABS Take 1 tablet by mouth 2 (two) times daily.    . carvedilol (COREG) 3.125 MG tablet Take 3.125 mg by mouth 2 (two) times daily with a meal.    . DULoxetine (CYMBALTA) 30 MG capsule Take 30 mg by mouth daily. Take along with 60mg  for a total daily dose of 90mg  daily.    . DULoxetine (CYMBALTA) 60 MG capsule Take 60 mg by mouth daily. Take along with 30mg  for a total daily dose of 90mg  daily.    . ferrous sulfate 325 (65 FE) MG tablet Take 325 mg by mouth every evening.    Marland Kitchen levothyroxine (SYNTHROID) 25 MCG tablet Take 37.5 mcg by mouth daily before breakfast.    . lisinopril (PRINIVIL,ZESTRIL) 2.5 MG tablet Take 2.5 mg by mouth daily.    . mirtazapine (REMERON) 7.5 MG tablet Take 7.5 mg by mouth at bedtime.    . Multiple Vitamin (MULTIVITAMIN) tablet Take 1 tablet by mouth daily.    . pantoprazole (PROTONIX) 40 MG tablet Take 40 mg by mouth daily.     . pramipexole (MIRAPEX) 1 MG tablet Take 1 mg by mouth 2 (two) times daily.    . rosuvastatin (CRESTOR) 10 MG tablet Take 10 mg by mouth daily.    Marland Kitchen spironolactone (ALDACTONE) 25 MG tablet Take 25 mg by mouth daily.     No current facility-administered medications for this visit.     PAST MEDICAL HISTORY: Past Medical History:  Diagnosis Date  . Atrial fibrillation ([place])   . Brain bleed ([place])    history in 2019  . Depression with anxiety   . Frequent falls   . Hyperlipemia   . Migraine   . Weakness     PAST SURGICAL HISTORY: Past Surgical History:  Procedure Laterality Date  . ABDOMINAL HYSTERECTOMY    . APPENDECTOMY    . BACK SURGERY    . IR ANGIOGRAM EXTREMITY BILATERAL  01/15/2019  . IR AORTAGRAM ABDOMINAL SERIALOGRAM  01/15/2019  . IR ILIAC ART STENT INC PTA MOD SED  01/15/2019  . IR TRANSCATH PLC STENT 1ST ART NOT LE CV CAR VERT CAR  01/15/2019  . IR US GUIDE VASC ACCESS LEFT  01/15/2019  . IR US GUIDE VASC ACCESS RIGHT  01/15/2019    FAMILY  HISTORY: Family History  Problem Relation Age of Onset  . Heart disease Mother   . Heart attack Father     SOCIAL HISTORY: Social History   Socioeconomic History  . Marital status: Married    Spouse name: Not on file  . Number of children: 5  . Years of education: 19  . Highest education level: High school graduate  Occupational History  . Occupation: Retired  Scientific laboratory technician  . Financial resource strain: Not on file  . Food insecurity    Worry: Not on file    Inability: Not on file  . Transportation needs    Medical: Not on file    Non-medical: Not on file  Tobacco Use  . Smoking status: Never Smoker  . Smokeless tobacco: Never Used  Substance and Sexual Activity  . Alcohol use: Never    Frequency: Never  . Drug use: Never  . Sexual activity: Not on file  Lifestyle  . Physical activity    Days per week: Not on file    Minutes  per session: Not on file  . Stress: Not on file  Relationships  . Social Herbalist on phone: Not on file    Gets together: Not on file    Attends religious service: Not on file    Active member of club or organization: Not on file    Attends meetings of clubs or organizations: Not on file    Relationship status: Not on file  . Intimate partner violence    Fear of current or ex partner: Not on file    Emotionally abused: Not on file    Physically abused: Not on file    Forced sexual activity: Not on file  Other Topics Concern  . Not on file  Social History Narrative   Lives at home with daughter.   Right-handed.   3-4 cups caffeine per day (Coke).     PHYSICAL EXAM   Vitals:   09/16/19 0854  BP: 125/75  Osborne: 87  Temp: 97.6 F (36.4 C)  Weight: 122 lb 8 oz (55.6 kg)  Height: 5\' 4"  (1.626 m)    Not recorded      Body mass index is 21.03 kg/m.  PHYSICAL EXAMNIATION:  Gen: NAD, conversant, well nourised, well groomed                     Cardiovascular: Regular rate rhythm, no peripheral edema, warm,  nontender. Eyes: Conjunctivae clear without exudates or hemorrhage Neck: Supple, no carotid bruits. Pulmonary: Clear to auscultation bilaterally   NEUROLOGICAL EXAM:  MENTAL STATUS: Speech:    Speech is normal; fluent and spontaneous with normal comprehension.  Cognition:     Orientation to time, place and person     Normal recent and remote memory     Normal Attention span and concentration     Normal Language, naming, repeating,spontaneous speech     Fund of knowledge   CRANIAL NERVES: CN II: Visual fields are full to confrontation.  Pupils are round equal and briskly reactive to light. CN III, IV, VI: extraocular movement are normal. No ptosis. CN V: Facial sensation is intact to pinprick in all 3 divisions bilaterally. Corneal responses are intact.  CN VII: Face is symmetric with normal eye closure and smile. CN VIII: Hearing is normal to causal conversation. CN IX, X: Palate elevates symmetrically. Phonation is normal. CN XI: Head turning and shoulder shrug are intact CN XII: Tongue is midline with normal movements and no atrophy.  MOTOR: There is no pronator drift of out-stretched arms. Muscle bulk and tone are normal. Muscle strength is normal.  REFLEXES: Reflexes are 3 and symmetric at the biceps, triceps, knees, and absent at ankles. Plantar responses are extensor bilaterally.  SENSORY: Length dependent decreased to light touch, pinprick and vibratory sensation to bilateral ankle levels.  COORDINATION: No truncal or limb dysmetria noted  GAIT/STANCE: She needs push-up to get up from seated position, mildly unsteady   DIAGNOSTIC DATA (LABS, IMAGING, TESTING) - I reviewed patient records, labs, notes, testing and imaging myself where available.   ASSESSMENT AND PLAN  LAVONE SCHNITZER is a 85 y.o. female   Gait abnormality  This is likely multifactorial, including aging, deconditioning, and joints pain  Left lower extremity involuntary jumpy movement,  suggestive of myoclonic activities  Laboratory evaluation showed significant worsening of kidney function GFR of 19,  Advised patient to contact her primary care physician for evaluation  Also advised her to keep well hydration,  Continue Physical therapy, moderate  exercise   Marcial Pacas, M.D. Ph.D.  [facility] [address] [place], [place] [address] Ph: [phone] Fax: [phone]  CC: [name], NP

## 2019-12-12 LAB — PROTIME-INR: INR: 1 (ref 0.9–1.1)

## 2020-01-02 DIAGNOSIS — E782 Mixed hyperlipidemia: Secondary | ICD-10-CM

## 2020-01-02 DIAGNOSIS — Z66 Do not resuscitate: Secondary | ICD-10-CM | POA: Insufficient documentation

## 2020-01-02 DIAGNOSIS — I480 Paroxysmal atrial fibrillation: Secondary | ICD-10-CM | POA: Insufficient documentation

## 2020-01-02 DIAGNOSIS — I2510 Atherosclerotic heart disease of native coronary artery without angina pectoris: Secondary | ICD-10-CM | POA: Insufficient documentation

## 2020-01-02 DIAGNOSIS — F0280 Dementia in other diseases classified elsewhere without behavioral disturbance: Secondary | ICD-10-CM

## 2020-01-02 DIAGNOSIS — G301 Alzheimer's disease with late onset: Secondary | ICD-10-CM | POA: Insufficient documentation

## 2020-01-02 HISTORY — DX: Dementia in other diseases classified elsewhere, unspecified severity, without behavioral disturbance, psychotic disturbance, mood disturbance, and anxiety: F02.80

## 2020-01-02 HISTORY — DX: Atherosclerotic heart disease of native coronary artery without angina pectoris: I25.10

## 2020-01-02 HISTORY — DX: Mixed hyperlipidemia: E78.2

## 2020-01-02 HISTORY — DX: Do not resuscitate: Z66

## 2020-02-09 ENCOUNTER — Encounter: Payer: Self-pay | Admitting: Cardiology

## 2020-02-09 ENCOUNTER — Ambulatory Visit (INDEPENDENT_AMBULATORY_CARE_PROVIDER_SITE_OTHER): Payer: Medicare (Managed Care) | Admitting: Cardiology

## 2020-02-09 ENCOUNTER — Other Ambulatory Visit: Payer: Self-pay

## 2020-02-09 ENCOUNTER — Telehealth: Payer: Self-pay | Admitting: Cardiology

## 2020-02-09 VITALS — BP 100/56 | HR 109 | Ht 64.0 in | Wt 128.0 lb

## 2020-02-09 DIAGNOSIS — E782 Mixed hyperlipidemia: Secondary | ICD-10-CM | POA: Diagnosis not present

## 2020-02-09 DIAGNOSIS — I609 Nontraumatic subarachnoid hemorrhage, unspecified: Secondary | ICD-10-CM | POA: Diagnosis not present

## 2020-02-09 DIAGNOSIS — I2510 Atherosclerotic heart disease of native coronary artery without angina pectoris: Secondary | ICD-10-CM | POA: Diagnosis not present

## 2020-02-09 DIAGNOSIS — I10 Essential (primary) hypertension: Secondary | ICD-10-CM

## 2020-02-09 DIAGNOSIS — I4811 Longstanding persistent atrial fibrillation: Secondary | ICD-10-CM

## 2020-02-09 MED ORDER — APIXABAN 2.5 MG PO TABS: 2.5000 mg | ORAL_TABLET | Freq: Two times a day (BID) | ORAL | 6 refills | Status: AC

## 2020-02-09 MED ORDER — DIGOXIN 125 MCG PO TABS: 0.1250 mg | ORAL_TABLET | Freq: Every day | ORAL | 3 refills | Status: DC

## 2020-02-09 NOTE — Patient Instructions (Signed)
Medication Instructions:   STOP TAKING ASPIRIN    START TAKING DIGOXIN 0.125 MCG ONCE AS DAY   START TAKING ELIQUIS  2.5 MG TWICE A DAY   *If you need a refill on your cardiac medications before your next appointment, please call your pharmacy*   Lab Work:  BMET MAG AND CBC TODAY   If you have labs (blood work) drawn today and your tests are completely normal, you will receive your results only by: Marland Kitchen MyChart Message (if you have MyChart) OR . A paper copy in the mail If you have any lab test that is abnormal or we need to change your treatment, we will call you to review the results.   Testing/Procedures: Your physician has requested that you have an echocardiogram. Echocardiography is a painless test that uses sound waves to create images of your heart. It provides your doctor with information about the size and shape of your heart and how well your heart's chambers and valves are working. This procedure takes approximately one hour. There are no restrictions for this procedure.  Non-Cardiac CT Angiography (CTA), is a special type of CT scan that uses a computer to produce multi-dimensional views of major blood vessels throughout the body. In CT angiography, a contrast material is injected through an IV to help visualize the blood vessels     Follow-Up: At [facility], you and your health needs are our priority.  As part of our continuing mission to provide you with exceptional heart care, we have created designated Provider Care Teams.  These Care Teams include your primary Cardiologist (physician) and Advanced Practice Providers (APPs -  Physician Assistants and Nurse Practitioners) who all work together to provide you with the care you need, when you need it.  We recommend signing up for the patient portal called "MyChart".  Sign up information is provided on this After Visit Summary.  MyChart is used to connect with patients for Virtual Visits (Telemedicine).  Patients are able  to view lab/test results, encounter notes, upcoming appointments, etc.  Non-urgent messages can be sent to your provider as well.   To learn more about what you can do with MyChart, go to [email or web address].    Your next appointment:   4 week(s)  The format for your next appointment:   In Person  Provider:   You will see Dr. Harriet Masson .  Or, you can be scheduled with the following Advanced Practice Provider on your designated Care Team (at our [facility]):  Laurann Montana, FNP     Other Instructions

## 2020-02-09 NOTE — Telephone Encounter (Signed)
[place], NP with [facility], states she is requesting to discuss appointment notes from appointment completed on today, 02/09/20 at 11:00 AM with Dr. Harriet Masson. She also states she would like to receive the call back this afternoon  if possible in order to call in medication. Please return call to [facility] at [phone] and ask for Jennifer Osborne.

## 2020-02-09 NOTE — Telephone Encounter (Signed)
Spoke with [place].  Thank you

## 2020-02-09 NOTE — Progress Notes (Addendum)
Cardiology Office Note:    Date:  02/09/2020   ID:  Jennifer Osborne, DOB Dec 08, 1930, MRN [id number]  PCP:  Berniece Salines, DO  Cardiologist:  No primary care provider on file.  Electrophysiologist:  None   Referring MD: [name], NP   Chief Complaint  Patient presents with  . Atrial Fibrillation    History of Present Illness:    Jennifer Osborne is a 86 y.o. female with a hx of atrial fibrillation, coronary artery disease, diastolic heart failure ejection fraction back in 2018 55 to [id number], diastolic dysfunction, hyperlipidemia, hypertension, history of subarachnoid hemorrhage in June 2018 per records the patient did have a CT head at [facility] which is concerning for small right temporal subarachnoid hemorrhage she was therefore transferred to [facility] on April 15, 2017.  Underwent CT of the head which did not show any subarachnoid hemorrhage.  She was seen by neurosurgery discharged home -her Eliquis was also continued at that time.  She is here today as the patient was noted to be atrial fibrillation per her notes in 2018 she does have known atrial fibrillation and has been on Eliquis in the past.  Discharged on 12/16/2018 and referred back that shows that the patient was also discontinued on Eliquis giving her her CTA was normal.   The patient is here with her daughter she tells me that she has been significantly short of breath and also feels that her heart rate is going significantly fast.  She denies any chest pain, headedness or dizziness.  Past Medical History:  Diagnosis Date  . Abnormal weight loss   . Ambulatory dysfunction   . Asthma   . Atrial fibrillation ([place])   . Basilar artery stenosis   . Bile reflux gastritis   . BMI 21.0-21.9, adult   . Brain bleed ([place])    history in 2019  . Cerumen impaction   . Chronic constipation   . Chronic nausea   . COPD (chronic obstructive pulmonary disease) ([place])   . Degenerative disc disease, lumbar   .  Depression with anxiety   . Diastolic CHF ([place])   . Dysphagia   . External hemorrhoids   . Frequent falls   . Generalized anxiety disorder   . GERD (gastroesophageal reflux disease)   . Hiatal hernia   . Hyperlipemia   . Hyperlipemia   . Hypothyroidism   . Insomnia   . Internal hemorrhoids   . Iron deficiency anemia   . Major depression   . Migraine   . Neurological disorder    Hunting[place]'s Similar  . Osteoarthritis, generalized   . PAD (peripheral artery disease) ([place])   . Paroxysmal atrial fibrillation (HCC)   . PVD (peripheral vascular disease) ([place])   . Radiculopathy    left lower leg  . Sigmoid diverticulosis   . SOB (shortness of breath)   . [place] dance   . Systemic lupus erythematosus (SLE) in remission ([place])   . Urinary incontinence   . Urinary tract infection   . Vascular dementia without behavioral disturbance ([place])   . Vitamin B 12 deficiency   . Vitamin D deficiency   . Weakness     Past Surgical History:  Procedure Laterality Date  . ABDOMINAL HYSTERECTOMY    . APPENDECTOMY    . BACK SURGERY    . IR ANGIOGRAM EXTREMITY BILATERAL  01/15/2019  . IR AORTAGRAM ABDOMINAL SERIALOGRAM  01/15/2019  . IR ILIAC ART STENT INC PTA MOD SED  01/15/2019  . IR TRANSCATH PLC STENT 1ST ART NOT LE CV CAR VERT CAR  01/15/2019  . IR US GUIDE VASC ACCESS LEFT  01/15/2019  . IR US GUIDE VASC ACCESS RIGHT  01/15/2019    Current Medications: Current Meds  Medication Sig  . albuterol (VENTOLIN HFA) 108 (90 Base) MCG/ACT inhaler Inhale 1 puff into the lungs every 4 (four) hours as needed.  . Calcium Carb-Cholecalciferol (CALCIUM EXTRA D3) 500-600 MG-UNIT TABS Take 1 tablet by mouth 2 (two) times daily.  Marland Kitchen diltiazem (DILACOR XR) 120 MG 24 hr capsule Take 120 mg by mouth in the morning and at bedtime.  . DULoxetine (CYMBALTA) 30 MG capsule Take 30 mg by mouth daily. Take along with 60mg  for a total daily dose of 90mg  daily.  . DULoxetine (CYMBALTA) 60 MG capsule Take 60 mg by  mouth daily. Take along with 30mg  for a total daily dose of 90mg  daily.  . Fluticasone-Umeclidin-Vilant (TRELEGY ELLIPTA) 100-62.5-25 MCG/INH AEPB Inhale 1 puff into the lungs daily.  Marland Kitchen levothyroxine (SYNTHROID) 25 MCG tablet Take 37.5 mcg by mouth daily before breakfast.  . lisinopril (ZESTRIL) 2.5 MG tablet Take 2.5 mg by mouth daily.  . mirtazapine (REMERON) 7.5 MG tablet Take 7.5 mg by mouth at bedtime.  . Multiple Vitamins-Minerals (ALIVE MULTI-VITAMIN PO) Take by mouth daily.  . pantoprazole (PROTONIX) 40 MG tablet Take 40 mg by mouth daily.   . rosuvastatin (CRESTOR) 20 MG tablet Take 20 mg by mouth daily.  Marland Kitchen spironolactone (ALDACTONE) 25 MG tablet Take 25 mg by mouth daily.  . sucralfate (CARAFATE) 1 g tablet Take 1 g by mouth 2 (two) times daily before a meal.  . tetrabenazine (XENAZINE) 12.5 MG tablet Take 12.5 mg by mouth daily.  . [DISCONTINUED] aspirin EC 81 MG tablet Take 81 mg by mouth daily.  . [DISCONTINUED] Fluticasone-Umeclidin-Vilant 100-62.5-25 MCG/INH AEPB Inhale 1 puff into the lungs daily.  . [DISCONTINUED] Lidocaine 4 % PTCH Apply 1 patch topically daily as needed.  . [DISCONTINUED] mirtazapine (REMERON) 7.5 MG tablet Take 7.5 mg by mouth at bedtime.  . [DISCONTINUED] ondansetron (ZOFRAN) 4 MG tablet Take 4 mg by mouth 3 (three) times daily as needed.  . [DISCONTINUED] Polyethylene Glycol 3350 POWD 17 g by Does not apply route daily as needed for constipation.  . [DISCONTINUED] triamcinolone (NASACORT) 55 MCG/ACT AERO nasal inhaler Place 2 sprays into the nose daily.     Allergies:   Morphine and related, Meperidine, and Pregabalin   Social History   Socioeconomic History  . Marital status: Married    Spouse name: Not on file  . Number of children: 5  . Years of education: 25  . Highest education level: High school graduate  Occupational History  . Occupation: Retired  Tobacco Use  . Smoking status: Never Smoker  . Smokeless tobacco: Never Used  Substance  and Sexual Activity  . Alcohol use: Never  . Drug use: Never  . Sexual activity: Not on file  Other Topics Concern  . Not on file  Social History Narrative   Lives at home with daughter.   Right-handed.   3-4 cups caffeine per day (Coke).   Social Determinants of Health   Financial Resource Strain:   . Difficulty of Paying Living Expenses:   Food Insecurity:   . Worried About Charity fundraiser in the Last Year:   . Arboriculturist in the Last Year:   Transportation Needs:   . Film/video editor (Medical):   Marland Kitchen  Lack of Transportation (Non-Medical):   Physical Activity:   . Days of Exercise per Week:   . Minutes of Exercise per Session:   Stress:   . Feeling of Stress :   Social Connections:   . Frequency of Communication with Friends and Family:   . Frequency of Social Gatherings with Friends and Family:   . Attends Religious Services:   . Active Member of Clubs or Organizations:   . Attends Archivist Meetings:   Marland Kitchen Marital Status:      Family History: The patient's family history includes Heart attack in her father; Heart disease in her mother.  ROS:   Review of Systems  Constitution: Negative for decreased appetite, fever and weight gain.  HENT: Negative for congestion, ear discharge, hoarse voice and sore throat.   Eyes: Negative for discharge, redness, vision loss in right eye and visual halos.  Cardiovascular: As above exertion.  Negative for chest pain, leg swelling, orthopnea and palpitations.  Respiratory: Negative for cough, hemoptysis, shortness of breath and snoring.   Endocrine: Negative for heat intolerance and polyphagia.  Hematologic/Lymphatic: Negative for bleeding problem. Does not bruise/bleed easily.  Skin: Negative for flushing, nail changes, rash and suspicious lesions.  Musculoskeletal: Negative for arthritis, joint pain, muscle cramps, myalgias, neck pain and stiffness.  Gastrointestinal: Negative for abdominal pain, bowel  incontinence, diarrhea and excessive appetite.  Genitourinary: Negative for decreased libido, genital sores and incomplete emptying.  Neurological: Negative for brief paralysis, focal weakness, headaches and loss of balance.  Psychiatric/Behavioral: Negative for altered mental status, depression and suicidal ideas.  Allergic/Immunologic: Negative for HIV exposure and persistent infections.    EKGs/Labs/Other Studies Reviewed:    The following studies were reviewed today:   EKG:  The ekg ordered today demonstrates fibrillation with rapid ventricular rate, heart rate 112 bpm occasional couplet.  Prior EKG for comparison however notes from for the past this indicates that patient does have history of A. Fib.  [facility] on [facility] 19, 2017 left ventricle size is normal.  There is mild concentric left ventricular hypertrophy.  LVEF 55 to 60%.  Diastolic filling pattern indicates impaired relaxation.  Left atrium is moderately dilated by volume.  Mild mitral regurgitation is present.  Mild tricuspid regurgitation is present.  MRI of the brain with and without contrast done June third 2020: Mild atrophy not unexpected for age.  No acute intracranial findings.  With progressive clinical question no significant white matter disease. CT head.  Mar 28, 2019 no intracranial trauma.  Minimal atrophy and microvascular disease. Recent Labs: 07/15/2019: ALT 15; BUN 34; Creatinine, Ser 2.24; Hemoglobin 12.2; Potassium 4.3; Sodium 135; TSH 0.705  Recent Lipid Panel No results found for: CHOL, TRIG, HDL, CHOLHDL, VLDL, LDLCALC, LDLDIRECT  Physical Exam:    VS:  BP (!) 100/56 (BP Location: Left Arm, Patient Position: Sitting, Cuff Size: Normal)   Pulse (!) 109   Ht 5\' 4"  (1.626 m)   Wt 128 lb (58.1 kg)   SpO2 98%   BMI 21.97 kg/m     Wt Readings from Last 3 Encounters:  02/09/20 128 lb (58.1 kg)  09/16/19 122 lb 8 oz (55.6 kg)  07/15/19 123 lb (55.8 kg)     GEN:  Well nourished, well developed in no acute distress HEENT: Normal NECK: No JVD; No carotid bruits LYMPHATICS: No lymphadenopathy CARDIAC: S1S2 noted,RRR, no murmurs, rubs, gallops RESPIRATORY:  Clear to auscultation without rales, wheezing or rhonchi  ABDOMEN: Soft, non-tender, non-distended, +bowel  sounds, no guarding. EXTREMITIES: No edema, No cyanosis, no clubbing MUSCULOSKELETAL:  No deformity  SKIN: Warm and dry NEUROLOGIC:  Alert and oriented x 3, non-focal PSYCHIATRIC:  Normal affect, good insight  ASSESSMENT:    1. Longstanding persistent atrial fibrillation ([place])   2. Chronic coronary artery disease   3. Nontraumatic subarachnoid hemorrhage ([place])   4. Mixed hyperlipidemia   5. Essential hypertension    PLAN:      It appears that the patient has had longstanding atrial fibrillation.  Eliquis 2.5 mg in the past unclear when this medication was stopped as it appears to have been continued after she was noted not to have any subarachnoid hemorrhage on her CT of the head while at [facility] in 2018.  She has been on aspirin with no recurrence any concern.  This time with her rapid ventricular rate I like to add digoxin 125 mg daily to the patient medication regimen.  She will continue on the carvedilol as well as the diltiazem.  CHA2DS2-VASc score is 5 points and her stroke risks is 10.0%.  We will decrease him back on home dose Eliquis 2.5 mg twice a day.  In the meantime we will get CTA of the head again for follow-up in 2018 CT of the head W that was negative as well.  Hypertension-no changes will be made to her antihypertensive medication.  Hyperlipidemia-continue patient on Crestor 20 mg daily.  Work will be done today which would include BMP, mag, CBC  The patient is in agreement with the above plan. The patient left the office in stable condition.  The patient will follow up in 4 weeks or sooner if needed.   After her visit - I was able to speak with the patient  [facility] who shared that she has head imaging in may and June 2020 which showed no brain bleed- I was able to review the report for both testing.  With this , I will cancel the previously ordered CTA head.   Medication Adjustments/Labs and Tests Ordered: Current medicines are reviewed at length with the patient today.  Concerns regarding medicines are outlined above.  Orders Placed This Encounter  Procedures  . CT ANGIO HEAD W OR WO CONTRAST  . Basic Metabolic Panel (BMET)  . Magnesium  . CBC  . EKG 12-Lead  . ECHOCARDIOGRAM COMPLETE   Meds ordered this encounter  Medications  . digoxin (LANOXIN) 0.125 MG tablet    Sig: Take 1 tablet (0.125 mg total) by mouth daily.    Dispense:  90 tablet    Refill:  3  . apixaban (ELIQUIS) 2.5 MG TABS tablet    Sig: Take 1 tablet (2.5 mg total) by mouth 2 (two) times daily.    Dispense:  30 tablet    Refill:  6    Patient Instructions  Medication Instructions:   STOP TAKING ASPIRIN    START TAKING DIGOXIN 0.125 MCG ONCE AS DAY   START TAKING ELIQUIS  2.5 MG TWICE A DAY   *If you need a refill on your cardiac medications before your next appointment, please call your pharmacy*   Lab Work:  BMET MAG AND CBC TODAY   If you have labs (blood work) drawn today and your tests are completely normal, you will receive your results only by: Marland Kitchen MyChart Message (if you have MyChart) OR . A paper copy in the mail If you have any lab test that is abnormal or we need to change  your treatment, we will call you to review the results.   Testing/Procedures: Your physician has requested that you have an echocardiogram. Echocardiography is a painless test that uses sound waves to create images of your heart. It provides your doctor with information about the size and shape of your heart and how well your heart's chambers and valves are working. This procedure takes approximately one hour. There are no restrictions for this procedure.  Non-Cardiac  CT Angiography (CTA), is a special type of CT scan that uses a computer to produce multi-dimensional views of major blood vessels throughout the body. In CT angiography, a contrast material is injected through an IV to help visualize the blood vessels     Follow-Up: At [facility], you and your health needs are our priority.  As part of our continuing mission to provide you with exceptional heart care, we have created designated Provider Care Teams.  These Care Teams include your primary Cardiologist (physician) and Advanced Practice Providers (APPs -  Physician Assistants and Nurse Practitioners) who all work together to provide you with the care you need, when you need it.  We recommend signing up for the patient portal called "MyChart".  Sign up information is provided on this After Visit Summary.  MyChart is used to connect with patients for Virtual Visits (Telemedicine).  Patients are able to view lab/test results, encounter notes, upcoming appointments, etc.  Non-urgent messages can be sent to your provider as well.   To learn more about what you can do with MyChart, go to [email or web address].    Your next appointment:   4 week(s)  The format for your next appointment:   In Person  Provider:   You will see Dr. Harriet Masson .  Or, you can be scheduled with the following Advanced Practice Provider on your designated Care Team (at our [facility]):  Laurann Montana, FNP     Other Instructions      Adopting a Healthy Lifestyle.  Know what a healthy weight is for you (roughly BMI <25) and aim to maintain this   Aim for 7+ servings of fruits and vegetables daily   65-80+ fluid ounces of water or unsweet tea for healthy kidneys   Limit to max 1 drink of alcohol per day; avoid smoking/tobacco   Limit animal fats in diet for cholesterol and heart health - choose grass fed whenever available   Avoid highly processed foods, and foods high in saturated/trans fats   Aim for  low stress - take time to unwind and care for your mental health   Aim for 150 min of moderate intensity exercise weekly for heart health, and weights twice weekly for bone health   Aim for 7-9 hours of sleep daily   When it comes to diets, agreement about the perfect plan isnt easy to find, even among the experts. Experts at the [employer] developed an idea known as the Healthy Eating Plate. Just imagine a plate divided into logical, healthy portions.   The emphasis is on diet quality:   Load up on vegetables and fruits - one-half of your plate: Aim for color and variety, and remember that potatoes dont count.   Go for whole grains - one-quarter of your plate: Whole wheat, barley, wheat berries, quinoa, oats, brown rice, and foods made with them. If you want pasta, go with whole wheat pasta.   Protein power - one-quarter of your plate: Fish, chicken, beans, and nuts are all healthy, versatile  protein sources. Limit red meat.   The diet, however, does go beyond the plate, offering a few other suggestions.   Use healthy plant oils, such as olive, canola, soy, corn, sunflower and peanut. Check the labels, and avoid partially hydrogenated oil, which have unhealthy trans fats.   If youre thirsty, drink water. Coffee and tea are good in moderation, but skip sugary drinks and limit milk and dairy products to one or two daily servings.   The type of carbohydrate in the diet is more important than the amount. Some sources of carbohydrates, such as vegetables, fruits, whole grains, and beans-are healthier than others.   Finally, stay active  Signed, Berniece Salines, DO  02/09/2020 8:55 PM    [facility]

## 2020-02-10 LAB — BASIC METABOLIC PANEL
BUN/Creatinine Ratio: 13 (ref 12–28)
BUN: 18 mg/dL (ref 8–27)
CO2: 22 mmol/L (ref 20–29)
Calcium: 9.5 mg/dL (ref 8.7–10.3)
Chloride: 101 mmol/L (ref 96–106)
Creatinine, Ser: 1.44 mg/dL — ABNORMAL HIGH (ref 0.57–1.00)
GFR calc Af Amer: 38 mL/min/{1.73_m2} — ABNORMAL LOW (ref >59)
GFR calc non Af Amer: 33 mL/min/{1.73_m2} — ABNORMAL LOW (ref >59)
Glucose: 76 mg/dL (ref 65–99)
Potassium: 4.6 mmol/L (ref 3.5–5.2)
Sodium: 139 mmol/L (ref 134–144)

## 2020-02-10 LAB — CBC
Hematocrit: 38.2 % (ref 34.0–46.6)
Hemoglobin: 12.3 g/dL (ref 11.1–15.9)
MCH: 28.8 pg (ref 26.6–33.0)
MCHC: 32.2 g/dL (ref 31.5–35.7)
MCV: 90 fL (ref 79–97)
Platelets: 287 10*3/uL (ref 150–450)
RBC: 4.27 x10E6/uL (ref 3.77–5.28)
RDW: 13.1 % (ref 11.7–15.4)
WBC: 7 10*3/uL (ref 3.4–10.8)

## 2020-02-10 LAB — MAGNESIUM: Magnesium: 1.9 mg/dL (ref 1.6–2.3)

## 2020-02-19 ENCOUNTER — Other Ambulatory Visit: Payer: No Typology Code available for payment source

## 2020-02-23 ENCOUNTER — Telehealth: Payer: Self-pay | Admitting: Cardiology

## 2020-02-23 NOTE — Telephone Encounter (Signed)
I received the patient E[facility] and lab work from [facility].  I was able to review her lab work digoxin therapy level 1.5. Chemistry: Sodium 134, potassium 4.4, chloride 96, bicarb 26, glucose 104, BUN 19, creatinine 1.4, calcium 9.2, albumin 4.1, total protein 6.8, total bili 0.5, AST 30, ALT 21, alk phos 72 EKG show atrial fibrillation with controlled ventricular rate, heart rate 74 bpm compared to prior EKG patient no longer in rapid ventricular rate.  Unfortunately her echocardiogram has not been done.  Prior discussion with [name], NP.  They preferred to have the patient do her echocardiogram at [facility] due to previous agreement.  I again spoke with [name], NP notify her to continue the patient on her current digoxin dosing.  I also inquired about why the patient echocardiogram had not been scheduled.  Per [place] she did order this testing but for some reason he had not been scheduled at [facility].  She is going to have this hopefully scheduled within a week.  My plan is to review her echocardiogram and hopefully start the patient on appropriate antiarrhythmic medication.  Per patient daughter she is still symptomatic in atrial fibrillation although her rate is controlled.

## 2020-02-25 DIAGNOSIS — I340 Nonrheumatic mitral (valve) insufficiency: Secondary | ICD-10-CM | POA: Diagnosis not present

## 2020-02-25 DIAGNOSIS — I361 Nonrheumatic tricuspid (valve) insufficiency: Secondary | ICD-10-CM

## 2020-03-01 NOTE — Telephone Encounter (Signed)
Jennifer Osborne, I believe she got her echo done at [place]. Please get a copy for my review tomorrow - then I can call the patient's daughter back. Thank you

## 2020-03-01 NOTE — Telephone Encounter (Signed)
Follow Up  Patient's daughter is calling in to check on the echocardiogram results. States that it has been done as of last week and wants the results reviewed and discuss. Please give patient's daughter a call back to discuss.

## 2020-03-02 NOTE — Telephone Encounter (Signed)
Copy of this echo was given to Dr. Harriet Masson for her review.

## 2020-03-08 ENCOUNTER — Other Ambulatory Visit: Payer: Self-pay

## 2020-03-08 ENCOUNTER — Ambulatory Visit (INDEPENDENT_AMBULATORY_CARE_PROVIDER_SITE_OTHER): Payer: No Typology Code available for payment source | Admitting: Cardiology

## 2020-03-08 ENCOUNTER — Telehealth: Payer: Self-pay

## 2020-03-08 ENCOUNTER — Encounter: Payer: Self-pay | Admitting: Cardiology

## 2020-03-08 ENCOUNTER — Telehealth: Payer: Self-pay | Admitting: Cardiology

## 2020-03-08 VITALS — BP 134/70 | HR 90 | Ht 64.0 in | Wt 128.0 lb

## 2020-03-08 DIAGNOSIS — I480 Paroxysmal atrial fibrillation: Secondary | ICD-10-CM

## 2020-03-08 DIAGNOSIS — Z01812 Encounter for preprocedural laboratory examination: Secondary | ICD-10-CM

## 2020-03-08 DIAGNOSIS — I2510 Atherosclerotic heart disease of native coronary artery without angina pectoris: Secondary | ICD-10-CM | POA: Diagnosis not present

## 2020-03-08 DIAGNOSIS — I10 Essential (primary) hypertension: Secondary | ICD-10-CM

## 2020-03-08 DIAGNOSIS — E782 Mixed hyperlipidemia: Secondary | ICD-10-CM

## 2020-03-08 DIAGNOSIS — I272 Pulmonary hypertension, unspecified: Secondary | ICD-10-CM

## 2020-03-08 HISTORY — DX: Essential (primary) hypertension: I10

## 2020-03-08 NOTE — Telephone Encounter (Signed)
   Isla Pence NP calling, she said she needs information of pt's cardioversion scheduled at [facility]  Please call

## 2020-03-08 NOTE — Patient Instructions (Addendum)
Medication Instructions:  Your physician recommends that you continue on your current medications as directed. Please refer to the Current Medication list given to you today.  *If you need a refill on your cardiac medications before your next appointment, please call your pharmacy*   Lab Work: Your physician recommends that you return for lab work in: TODAY CBC, BMP, Mag, Digoxin Level If you have labs (blood work) drawn today and your tests are completely normal, you will receive your results only by: Marland Kitchen MyChart Message (if you have MyChart) OR . A paper copy in the mail If you have any lab test that is abnormal or we need to change your treatment, we will call you to review the results.   Testing/Procedures: You are scheduled for a cardioversion at [facility] ( [address], [place], [place] [address]) on _______  DIET: Nothing to eat or drink after midnight except a sip of water with medications (see medication instructions below)  Medication Instructions: Hold Spironolactone the morning of the procedure  Continue your anticoagulant: Eliquis You will need to continue your anticoagulant after your procedure until you are told by your  Provider that it is safe to stop  You must have a responsible person to drive you home and stay in the waiting area during your procedure. Failure to do so could result in cancellation.  Bring your insurance cards.  *Special Note: Every effort is made to have your procedure done on time. Occasionally there are emergencies that occur at the hospital that may cause delays. Please be patient if a delay does occur.     Follow-Up: At [facility], you and your health needs are our priority.  As part of our continuing mission to provide you with exceptional heart care, we have created designated Provider Care Teams.  These Care Teams include your primary Cardiologist (physician) and Advanced Practice Providers (APPs -  Physician Assistants and Nurse  Practitioners) who all work together to provide you with the care you need, when you need it.  We recommend signing up for the patient portal called "MyChart".  Sign up information is provided on this After Visit Summary.  MyChart is used to connect with patients for Virtual Visits (Telemedicine).  Patients are able to view lab/test results, encounter notes, upcoming appointments, etc.  Non-urgent messages can be sent to your provider as well.   To learn more about what you can do with MyChart, go to [email or web address].    Your next appointment:   1 month(s)  The format for your next appointment:   In Person  Provider:   Berniece Salines, DO   Other Instructions

## 2020-03-08 NOTE — Progress Notes (Addendum)
Cardiology Office Note:    Date:  03/08/2020   ID:  Jennifer Osborne, DOB February 04, 1931, MRN [id number]  PCP:  [name], NP  Cardiologist:  Berniece Salines, DO  Electrophysiologist:  None   Referring MD: [name], NP   Chief Complaint  Patient presents with  . Follow-up    History of Present Illness:    Jennifer Osborne is a 86 y.o. female with a hx of atrial fibrillation, coronary artery disease, diastolic heart failure ejection fraction back in 2018 55 to [id number], diastolic dysfunction, hyperlipidemia, hypertension, history of subarachnoid hemorrhage  history of subarachnoid hemorrhage in June 2018 per records the patient did have a CT head at [facility] which is concerning for small right temporal subarachnoid hemorrhage she was therefore transferred to [facility] on April 15, 2017.  Underwent CT of the head which did not show any subarachnoid hemorrhage.  She was seen by neurosurgery discharged home -her Eliquis was also continued.  I did see the patient on February 09, 2020 at that time she was presenting due to atrial fibrillation with rapid ventricular rate.  Started patient on digoxin due to hypotension and fast heart rate.  I also recommended patient to go echocardiogram which she was able to get in the interim.  She is here today for follow-up visit she is with her daughter she offers no complaints at this time.  Past Medical History:  Diagnosis Date  . Abnormal weight loss   . Ambulatory dysfunction   . Asthma   . Atrial fibrillation ([place])   . Basilar artery stenosis   . Bile reflux gastritis   . BMI 21.0-21.9, adult   . Brain bleed ([place])    history in 2019  . Cerumen impaction   . Chronic constipation   . Chronic nausea   . COPD (chronic obstructive pulmonary disease) ([place])   . Degenerative disc disease, lumbar   . Depression with anxiety   . Diastolic CHF ([place])   . Dysphagia   . External hemorrhoids   . Frequent falls   . Generalized anxiety  disorder   . GERD (gastroesophageal reflux disease)   . Hiatal hernia   . Hyperlipemia   . Hyperlipemia   . Hypothyroidism   . Insomnia   . Internal hemorrhoids   . Iron deficiency anemia   . Major depression   . Migraine   . Neurological disorder    Huntington's Similar  . Osteoarthritis, generalized   . PAD (peripheral artery disease) ([place])   . Paroxysmal atrial fibrillation (HCC)   . PVD (peripheral vascular disease) ([place])   . Radiculopathy    left lower leg  . Sigmoid diverticulosis   . SOB (shortness of breath)   . [place] dance   . Systemic lupus erythematosus (SLE) in remission ([place])   . Urinary incontinence   . Urinary tract infection   . Vascular dementia without behavioral disturbance ([place])   . Vitamin B 12 deficiency   . Vitamin D deficiency   . Weakness     Past Surgical History:  Procedure Laterality Date  . ABDOMINAL HYSTERECTOMY    . APPENDECTOMY    . BACK SURGERY    . IR ANGIOGRAM EXTREMITY BILATERAL  01/15/2019  . IR AORTAGRAM ABDOMINAL SERIALOGRAM  01/15/2019  . IR ILIAC ART STENT INC PTA MOD SED  01/15/2019  . IR TRANSCATH PLC STENT 1ST ART NOT LE CV CAR VERT CAR  01/15/2019  . IR US GUIDE VASC  ACCESS LEFT  01/15/2019  . IR US GUIDE VASC ACCESS RIGHT  01/15/2019    Current Medications: Current Meds  Medication Sig  . albuterol (VENTOLIN HFA) 108 (90 Base) MCG/ACT inhaler Inhale 1 puff into the lungs every 4 (four) hours as needed.  Marland Kitchen apixaban (ELIQUIS) 2.5 MG TABS tablet Take 1 tablet (2.5 mg total) by mouth 2 (two) times daily.  . Calcium Carb-Cholecalciferol (CALCIUM EXTRA D3) 500-600 MG-UNIT TABS Take 1 tablet by mouth 2 (two) times daily.  . digoxin (LANOXIN) 0.125 MG tablet Take 1 tablet (0.125 mg total) by mouth daily.  Marland Kitchen diltiazem (DILACOR XR) 120 MG 24 hr capsule Take 120 mg by mouth in the morning and at bedtime.  . DULoxetine (CYMBALTA) 30 MG capsule Take 30 mg by mouth daily. Take along with 60mg  for a total daily dose of 90mg  daily.  .  DULoxetine (CYMBALTA) 60 MG capsule Take 60 mg by mouth daily. Take along with 30mg  for a total daily dose of 90mg  daily.  . Fluticasone-Umeclidin-Vilant (TRELEGY ELLIPTA) 100-62.5-25 MCG/INH AEPB Inhale 1 puff into the lungs daily.  Marland Kitchen levothyroxine (SYNTHROID) 25 MCG tablet Take 37.5 mcg by mouth daily before breakfast.  . lisinopril (ZESTRIL) 2.5 MG tablet Take 2.5 mg by mouth daily.  . mirtazapine (REMERON) 7.5 MG tablet Take 7.5 mg by mouth at bedtime.  . Multiple Vitamins-Minerals (ALIVE MULTI-VITAMIN PO) Take by mouth daily.  . pantoprazole (PROTONIX) 40 MG tablet Take 40 mg by mouth daily.   . rosuvastatin (CRESTOR) 20 MG tablet Take 20 mg by mouth daily.  Marland Kitchen spironolactone (ALDACTONE) 25 MG tablet Take 25 mg by mouth daily.  . sucralfate (CARAFATE) 1 g tablet Take 1 g by mouth 2 (two) times daily before a meal.  . tetrabenazine (XENAZINE) 12.5 MG tablet Take 12.5 mg by mouth daily.     Allergies:   Morphine and related, Meperidine, and Pregabalin   Social History   Socioeconomic History  . Marital status: Married    Spouse name: Not on file  . Number of children: 5  . Years of education: 88  . Highest education level: High school graduate  Occupational History  . Occupation: Retired  Tobacco Use  . Smoking status: Never Smoker  . Smokeless tobacco: Never Used  Substance and Sexual Activity  . Alcohol use: Never  . Drug use: Never  . Sexual activity: Not on file  Other Topics Concern  . Not on file  Social History Narrative   Lives at home with daughter.   Right-handed.   3-4 cups caffeine per day (Coke).   Social Determinants of Health   Financial Resource Strain:   . Difficulty of Paying Living Expenses:   Food Insecurity:   . Worried About Charity fundraiser in the Last Year:   . Arboriculturist in the Last Year:   Transportation Needs:   . Film/video editor (Medical):   Marland Kitchen Lack of Transportation (Non-Medical):   Physical Activity:   . Days of Exercise  per Week:   . Minutes of Exercise per Session:   Stress:   . Feeling of Stress :   Social Connections:   . Frequency of Communication with Friends and Family:   . Frequency of Social Gatherings with Friends and Family:   . Attends Religious Services:   . Active Member of Clubs or Organizations:   . Attends Archivist Meetings:   Marland Kitchen Marital Status:      Family History: The patient's  family history includes Heart attack in her father; Heart disease in her mother.  ROS:   Review of Systems  Constitution: Negative for decreased appetite, fever and weight gain.  HENT: Negative for congestion, ear discharge, hoarse voice and sore throat.   Eyes: Negative for discharge, redness, vision loss in right eye and visual halos.  Cardiovascular: Negative for chest pain, dyspnea on exertion, leg swelling, orthopnea and palpitations.  Respiratory: Negative for cough, hemoptysis, shortness of breath and snoring.   Endocrine: Negative for heat intolerance and polyphagia.  Hematologic/Lymphatic: Negative for bleeding problem. Does not bruise/bleed easily.  Skin: Negative for flushing, nail changes, rash and suspicious lesions.  Musculoskeletal: Negative for arthritis, joint pain, muscle cramps, myalgias, neck pain and stiffness.  Gastrointestinal: Negative for abdominal pain, bowel incontinence, diarrhea and excessive appetite.  Genitourinary: Negative for decreased libido, genital sores and incomplete emptying.  Neurological: Negative for brief paralysis, focal weakness, headaches and loss of balance.  Psychiatric/Behavioral: Negative for altered mental status, depression and suicidal ideas.  Allergic/Immunologic: Negative for HIV exposure and persistent infections.    EKGs/Labs/Other Studies Reviewed:    The following studies were reviewed today:   EKG:  The ekg ordered today demonstrates atrial fibrillation with slow ventricular rate, heart rate 55 bpm, compared to prior EKG patient no  longer in rapid ventricular rate.  Echocardiogram done at [facility] on February 26, 2020 shows normal left ventricular chamber size.  Normal left rectal wall thickness.  EF 60 to 65%.  Right ventricle normal in size and function.  Mildly thickness of the right ventricle wall.  Left atrium severely dilated.  Right atrium is normal in size.  Mild annular calcification present.  Mild mitral regurgitation.  Mitral cusp regurgitation.  Mild pulmonary hypertension.  Recent Labs: 07/15/2019: ALT 15; TSH 0.705 02/09/2020: BUN 18; Creatinine, Ser 1.44; Hemoglobin 12.3; Magnesium 1.9; Platelets 287; Potassium 4.6; Sodium 139  Recent Lipid Panel No results found for: CHOL, TRIG, HDL, CHOLHDL, VLDL, LDLCALC, LDLDIRECT  Physical Exam:    VS:  BP 134/70 (BP Location: Right Arm, Patient Position: Sitting, Cuff Size: Normal)   Pulse 90   Ht 5\' 4"  (1.626 m)   Wt 128 lb (58.1 kg)   SpO2 94%   BMI 21.97 kg/m     Wt Readings from Last 3 Encounters:  03/08/20 128 lb (58.1 kg)  02/09/20 128 lb (58.1 kg)  09/16/19 122 lb 8 oz (55.6 kg)     GEN: Well nourished, well developed in no acute distress HEENT: Normal NECK: No JVD; No carotid bruits LYMPHATICS: No lymphadenopathy CARDIAC: S1S2 noted,RRR, no murmurs, rubs, gallops RESPIRATORY:  Clear to auscultation without rales, wheezing or rhonchi  ABDOMEN: Soft, non-tender, non-distended, +bowel sounds, no guarding. EXTREMITIES: No edema, No cyanosis, no clubbing MUSCULOSKELETAL:  No deformity  SKIN: Warm and dry NEUROLOGIC:  Alert and oriented x 3, non-focal PSYCHIATRIC:  Normal affect, good insight  ASSESSMENT:    1. Paroxysmal atrial fibrillation (HCC)   2. Chronic coronary artery disease   3. Mixed hyperlipidemia   4. Essential hypertension   5. Mild pulmonary hypertension (HCC)    PLAN:     She still is in atrial fibrillation today.  She is complaining about the symptoms of shortness of breath and really do think this is related to her  atrial fibrillation.  The patient is here with her daughter today, I discussed the option of starting antiarrhythmics versus electrical cardioversion.  They are both interested in moving forward with electrical cardioversion.  I  was able to educate the patient about the procedure.  She also understands that she cannot stop her Eliquis for no reason before her procedure and postprocedure cannot stop this for 4 weeks for any reason.  I am hoping that I can start the patient on antiarrhythmics with amiodarone.  You are planning to get this procedure done at [facility].  This will also be coordinated with stay well as the patient will need a Covid test prior to her procedure.  Hypertension-blood pressure deceptively in the office today.  No changes will be made  Hyperlipidemia-continue patient her rosuvastatin.  Blood work will be done today to assess BMP, mag, CBC as well as digoxin level.  Mild pulmonary hypertension-no signs of heart failure.  We will continue to monitor especially pulse cardioversion if shortness of breath progresses.  The patient is in agreement with the above plan. The patient left the office in stable condition.  The patient will follow up in 1 month   Medication Adjustments/Labs and Tests Ordered: Current medicines are reviewed at length with the patient today.  Concerns regarding medicines are outlined above.  Orders Placed This Encounter  Procedures  . Magnesium  . Basic Metabolic Panel (BMET)  . CBC  . Digoxin level  . EKG 12-Lead   No orders of the defined types were placed in this encounter.   Patient Instructions  Medication Instructions:  Your physician recommends that you continue on your current medications as directed. Please refer to the Current Medication list given to you today.  *If you need a refill on your cardiac medications before your next appointment, please call your pharmacy*   Lab Work: Your physician recommends that you return  for lab work in: TODAY CBC, BMP, Mag, Digoxin Level If you have labs (blood work) drawn today and your tests are completely normal, you will receive your results only by: Marland Kitchen MyChart Message (if you have MyChart) OR . A paper copy in the mail If you have any lab test that is abnormal or we need to change your treatment, we will call you to review the results.   Testing/Procedures: You are scheduled for a cardioversion at [facility] ( [address], [place], [place] [address]) on _______  DIET: Nothing to eat or drink after midnight except a sip of water with medications (see medication instructions below)  Medication Instructions: Hold Spironolactone the morning of the procedure  Continue your anticoagulant: Eliquis You will need to continue your anticoagulant after your procedure until you are told by your  Provider that it is safe to stop  You must have a responsible person to drive you home and stay in the waiting area during your procedure. Failure to do so could result in cancellation.  Bring your insurance cards.  *Special Note: Every effort is made to have your procedure done on time. Occasionally there are emergencies that occur at the hospital that may cause delays. Please be patient if a delay does occur.     Follow-Up: At [facility], you and your health needs are our priority.  As part of our continuing mission to provide you with exceptional heart care, we have created designated Provider Care Teams.  These Care Teams include your primary Cardiologist (physician) and Advanced Practice Providers (APPs -  Physician Assistants and Nurse Practitioners) who all work together to provide you with the care you need, when you need it.  We recommend signing up for the patient portal called "MyChart".  Sign up information  is provided on this After Visit Summary.  MyChart is used to connect with patients for Virtual Visits (Telemedicine).  Patients are able to view lab/test  results, encounter notes, upcoming appointments, etc.  Non-urgent messages can be sent to your provider as well.   To learn more about what you can do with MyChart, go to [email or web address].    Your next appointment:   1 month(s)  The format for your next appointment:   In Person  Provider:   Berniece Salines, DO   Other Instructions      Adopting a Healthy Lifestyle.  Know what a healthy weight is for you (roughly BMI <25) and aim to maintain this   Aim for 7+ servings of fruits and vegetables daily   65-80+ fluid ounces of water or unsweet tea for healthy kidneys   Limit to max 1 drink of alcohol per day; avoid smoking/tobacco   Limit animal fats in diet for cholesterol and heart health - choose grass fed whenever available   Avoid highly processed foods, and foods high in saturated/trans fats   Aim for low stress - take time to unwind and care for your mental health   Aim for 150 min of moderate intensity exercise weekly for heart health, and weights twice weekly for bone health   Aim for 7-9 hours of sleep daily   When it comes to diets, agreement about the perfect plan isnt easy to find, even among the experts. Experts at the [employer] developed an idea known as the Healthy Eating Plate. Just imagine a plate divided into logical, healthy portions.   The emphasis is on diet quality:   Load up on vegetables and fruits - one-half of your plate: Aim for color and variety, and remember that potatoes dont count.   Go for whole grains - one-quarter of your plate: Whole wheat, barley, wheat berries, quinoa, oats, brown rice, and foods made with them. If you want pasta, go with whole wheat pasta.   Protein power - one-quarter of your plate: Fish, chicken, beans, and nuts are all healthy, versatile protein sources. Limit red meat.   The diet, however, does go beyond the plate, offering a few other suggestions.   Use healthy plant oils, such as  olive, canola, soy, corn, sunflower and peanut. Check the labels, and avoid partially hydrogenated oil, which have unhealthy trans fats.   If youre thirsty, drink water. Coffee and tea are good in moderation, but skip sugary drinks and limit milk and dairy products to one or two daily servings.   The type of carbohydrate in the diet is more important than the amount. Some sources of carbohydrates, such as vegetables, fruits, whole grains, and beans-are healthier than others.   Finally, stay active  Signed, Berniece Salines, DO  03/08/2020 12:31 PM    [facility]

## 2020-03-08 NOTE — Telephone Encounter (Signed)
Spoke to patients daughter and let her know of the patients scheduled cardioversion on 03/12/20 at 9AM. The patient needs to arrive at 07:30AM.   I also went over her need for a COVID swab with her and she verified understanding of this. She states that she is going to call [facility] and will discuss with them if they can do the COVID swab or not and will let us know what they say. I did let her know that if they can not do it then she could go to an urgent care to have the COVID swab done but it does need to be done and the results need to be brought to [place] in the office by 1pm on that Thursday.

## 2020-03-08 NOTE — Telephone Encounter (Signed)
I spoke to Isla Pence, NP just now and she informed me that she needs an order for the cardioversion and the COVID swab to have on file there at [facility]. I will fax these orders to them at this time.

## 2020-03-09 ENCOUNTER — Telehealth: Payer: Self-pay | Admitting: Cardiology

## 2020-03-09 ENCOUNTER — Telehealth: Payer: Self-pay

## 2020-03-09 LAB — BASIC METABOLIC PANEL
BUN/Creatinine Ratio: 9 — ABNORMAL LOW (ref 12–28)
BUN: 19 mg/dL (ref 8–27)
CO2: 21 mmol/L (ref 20–29)
Calcium: 8.7 mg/dL (ref 8.7–10.3)
Chloride: 101 mmol/L (ref 96–106)
Creatinine, Ser: 2.05 mg/dL — ABNORMAL HIGH (ref 0.57–1.00)
GFR calc Af Amer: 25 mL/min/{1.73_m2} — ABNORMAL LOW (ref >59)
GFR calc non Af Amer: 21 mL/min/{1.73_m2} — ABNORMAL LOW (ref >59)
Glucose: 91 mg/dL (ref 65–99)
Potassium: 4.9 mmol/L (ref 3.5–5.2)
Sodium: 138 mmol/L (ref 134–144)

## 2020-03-09 LAB — CBC
Hematocrit: 33.7 % — ABNORMAL LOW (ref 34.0–46.6)
Hemoglobin: 10.8 g/dL — ABNORMAL LOW (ref 11.1–15.9)
MCH: 29.3 pg (ref 26.6–33.0)
MCHC: 32 g/dL (ref 31.5–35.7)
MCV: 91 fL (ref 79–97)
Platelets: 210 10*3/uL (ref 150–450)
RBC: 3.69 x10E6/uL — ABNORMAL LOW (ref 3.77–5.28)
RDW: 13.2 % (ref 11.7–15.4)
WBC: 5.6 10*3/uL (ref 3.4–10.8)

## 2020-03-09 LAB — DIGOXIN LEVEL: Digoxin, Serum: 3.7 ng/mL (ref 0.5–0.9)

## 2020-03-09 LAB — MAGNESIUM: Magnesium: 1.7 mg/dL (ref 1.6–2.3)

## 2020-03-09 NOTE — Telephone Encounter (Signed)
Shelly from [employer] with critical lab results. Transferred call to the nurse.

## 2020-03-09 NOTE — Telephone Encounter (Signed)
   [facility] calling, while on hold, line got disconnected

## 2020-03-09 NOTE — Telephone Encounter (Signed)
-----   Message from Berniece Salines, DO sent at 03/09/2020 10:18 AM EDT ----- Please call they will speak with patient PCP [name].  Ask her to cut back on the digoxin on hold at 3 days.  I asked him to repeat the level and if the level is within therapeutic range to start a digoxin only on Monday Wednesdays and Fridays. A BMP should also be repeated as her creatinine here is 2.05 which was 1.44 at her last visit. Slightly decreased to 10.8 from 12.3.

## 2020-03-09 NOTE — Telephone Encounter (Signed)
I spoke to [facility] at [place]. She was calling the critical results on the patients digoxin level. This was already reviewed by Dr. Harriet Masson earlier today.

## 2020-03-09 NOTE — Telephone Encounter (Signed)
Spoke with Isla Pence, NP in regards to these results and recommendations. She states that she will make these medication changes and will re-check the labs as requested.   She also let me know that the patient was requesting that we reschedule her cardioversion. I called to reschedule this and it is scheduled now for Tuesday 03/16/20. I left a message with Ronnald Ramp, NP letting her know of this reschedule date and letting her know that the patient will need to be there at 7:30 AM.

## 2020-03-11 ENCOUNTER — Telehealth: Payer: Self-pay

## 2020-03-11 NOTE — Telephone Encounter (Signed)
Spoke to [employer] at [employer] [employer] [facility]. I let her know that the patient was set up for her cardioversion at [facility] on 03/16/2020. The patient needs to arrive at 06:30 but the procedure will start at [facility]. They verbalize understanding of this and state that they will let the patient and family know as well. I requested that they send [place] this patients lab work and COVID results as soon as possible and they state that they will do this as well.  No other questions or concerns were noted.    Encouraged patient/nursing staff to call back with any questions or concerns.

## 2020-03-16 ENCOUNTER — Telehealth: Payer: Self-pay | Admitting: Cardiology

## 2020-03-16 DIAGNOSIS — I4819 Other persistent atrial fibrillation: Secondary | ICD-10-CM

## 2020-03-16 NOTE — Telephone Encounter (Signed)
Spoke with Brittney at [facility] just now. She let me know that she would be doing the EKG in one week for this patient and would fax [place] the results. We also set up an appointment for the patient to be seen in the office by Dr. Harriet Masson on 04/01/2020 as this was the first opening Dr. Harriet Masson had.

## 2020-03-16 NOTE — Telephone Encounter (Signed)
[place] NP at [facility] just wants to confirm that all that's needed prior to the patients cardioversion is an EKG. She wants to make sure no appt is needed. If an EKG is the only thing that needs to be done they will do it at their facility and fax over the results.

## 2020-03-18 ENCOUNTER — Telehealth: Payer: Self-pay | Admitting: Cardiology

## 2020-03-18 NOTE — Telephone Encounter (Signed)
Spoke with Cecille Rubin just now and let her know that I would cancel the appointment on the 10th at this time since the patient is being seen on the 3rd. I let her know that we would just see her on the 3rd and then make a decision from there as to when she needs to be seen again.

## 2020-03-18 NOTE — Telephone Encounter (Signed)
New message   Cecille Rubin wants to know if patient needs to keep both appts. Please cal to discuss.

## 2020-03-23 ENCOUNTER — Telehealth: Payer: Self-pay | Admitting: Cardiology

## 2020-03-23 DIAGNOSIS — E785 Hyperlipidemia, unspecified: Secondary | ICD-10-CM

## 2020-03-23 DIAGNOSIS — I4891 Unspecified atrial fibrillation: Secondary | ICD-10-CM

## 2020-03-23 DIAGNOSIS — I609 Nontraumatic subarachnoid hemorrhage, unspecified: Secondary | ICD-10-CM | POA: Diagnosis not present

## 2020-03-23 DIAGNOSIS — E876 Hypokalemia: Secondary | ICD-10-CM

## 2020-03-23 NOTE — Telephone Encounter (Signed)
[place] from [facility] is calling to update Lilia Pro on this patient. Please call [phone] and ask for clinic.

## 2020-03-23 NOTE — Telephone Encounter (Signed)
Spoke to [place] just now from [facility] and she let me know that the patient is back in A-fib based on the EKG they did there. She also let me know that the patient is taking her Digoxin on Monday, Wednesday, and Friday due to her Digoxin levels being high when she was taking it Daily.   I will route to Dr. Harriet Masson just as an Juluis Rainier.

## 2020-03-24 ENCOUNTER — Telehealth: Payer: Self-pay | Admitting: Cardiology

## 2020-03-24 DIAGNOSIS — I4891 Unspecified atrial fibrillation: Secondary | ICD-10-CM | POA: Diagnosis not present

## 2020-03-24 DIAGNOSIS — I609 Nontraumatic subarachnoid hemorrhage, unspecified: Secondary | ICD-10-CM | POA: Diagnosis not present

## 2020-03-24 DIAGNOSIS — E876 Hypokalemia: Secondary | ICD-10-CM | POA: Diagnosis not present

## 2020-03-24 NOTE — Telephone Encounter (Signed)
Left message for patient daughter to return call.  

## 2020-03-24 NOTE — Telephone Encounter (Signed)
Called and spoke with patient's daughter per dpr. She reports the patient is admitted to [facility] and back in atrial fibrillation and she is wondering if a pacemaker can be placed now if needed, she reports the doctor who did the cardioversion mentioned that so she was wondering. I did advise her that can't be done at [place] that I am aware of but I would consult with Dr. Harriet Masson and follow up with her. She also wanted to know if Dr. Harriet Masson could go through the patient's medications, she thinks something just isn't right as the patient has been sleeping a lot, has no energy, and no appetite. I advised her since she is admitted to the hospital we typically do not adjust medications during an admission from the office to prevent confusion during the admission. However I will still consult with Dr. Harriet Masson and see if she can advise further. She thanked me for the call and had no further questions.

## 2020-03-24 NOTE — Telephone Encounter (Signed)
Please let her that I am not rounding in the hospital but I have spoken to Dr. Agustin Cree who is rounding and her will see in the hospital.

## 2020-03-24 NOTE — Telephone Encounter (Signed)
Patient daughter called back she was informed Dr. Harriet Masson has already spoke with Dr. Agustin Cree about the patient. She thanked me for the call no further questions.

## 2020-03-24 NOTE — Telephone Encounter (Signed)
Patient's daughter calling stating the patient is back in afib and was admitted to the hospital last night. She would like a call back to discuss the patient's updated status.

## 2020-03-25 DIAGNOSIS — Z7901 Long term (current) use of anticoagulants: Secondary | ICD-10-CM | POA: Diagnosis not present

## 2020-03-25 DIAGNOSIS — E876 Hypokalemia: Secondary | ICD-10-CM

## 2020-03-25 DIAGNOSIS — I609 Nontraumatic subarachnoid hemorrhage, unspecified: Secondary | ICD-10-CM | POA: Diagnosis not present

## 2020-03-25 DIAGNOSIS — D649 Anemia, unspecified: Secondary | ICD-10-CM

## 2020-03-25 DIAGNOSIS — I4891 Unspecified atrial fibrillation: Secondary | ICD-10-CM | POA: Diagnosis not present

## 2020-03-25 DIAGNOSIS — I480 Paroxysmal atrial fibrillation: Secondary | ICD-10-CM | POA: Diagnosis not present

## 2020-03-26 ENCOUNTER — Inpatient Hospital Stay (HOSPITAL_COMMUNITY)
Admission: AD | Admit: 2020-03-26 | Discharge: 2020-03-30 | DRG: 377 | Disposition: A | Discharge status: Home Health | Payer: No Typology Code available for payment source | Source: Other Acute Inpatient Hospital | Attending: Internal Medicine | Admitting: Internal Medicine

## 2020-03-26 ENCOUNTER — Other Ambulatory Visit: Payer: Self-pay

## 2020-03-26 ENCOUNTER — Encounter (HOSPITAL_COMMUNITY): Payer: Self-pay | Admitting: Student

## 2020-03-26 DIAGNOSIS — I10 Essential (primary) hypertension: Secondary | ICD-10-CM | POA: Diagnosis not present

## 2020-03-26 DIAGNOSIS — E039 Hypothyroidism, unspecified: Secondary | ICD-10-CM | POA: Diagnosis present

## 2020-03-26 DIAGNOSIS — N183 Chronic kidney disease, stage 3 unspecified: Secondary | ICD-10-CM | POA: Diagnosis present

## 2020-03-26 DIAGNOSIS — D490 Neoplasm of unspecified behavior of digestive system: Secondary | ICD-10-CM | POA: Diagnosis not present

## 2020-03-26 DIAGNOSIS — R195 Other fecal abnormalities: Secondary | ICD-10-CM

## 2020-03-26 DIAGNOSIS — K921 Melena: Principal | ICD-10-CM | POA: Diagnosis present

## 2020-03-26 DIAGNOSIS — Z8719 Personal history of other diseases of the digestive system: Secondary | ICD-10-CM

## 2020-03-26 DIAGNOSIS — Z885 Allergy status to narcotic agent status: Secondary | ICD-10-CM

## 2020-03-26 DIAGNOSIS — K219 Gastro-esophageal reflux disease without esophagitis: Secondary | ICD-10-CM | POA: Diagnosis present

## 2020-03-26 DIAGNOSIS — J189 Pneumonia, unspecified organism: Secondary | ICD-10-CM

## 2020-03-26 DIAGNOSIS — E876 Hypokalemia: Secondary | ICD-10-CM | POA: Diagnosis present

## 2020-03-26 DIAGNOSIS — K449 Diaphragmatic hernia without obstruction or gangrene: Secondary | ICD-10-CM | POA: Diagnosis present

## 2020-03-26 DIAGNOSIS — F411 Generalized anxiety disorder: Secondary | ICD-10-CM | POA: Diagnosis present

## 2020-03-26 DIAGNOSIS — I5032 Chronic diastolic (congestive) heart failure: Secondary | ICD-10-CM | POA: Diagnosis present

## 2020-03-26 DIAGNOSIS — T185XXA Foreign body in anus and rectum, initial encounter: Secondary | ICD-10-CM | POA: Diagnosis not present

## 2020-03-26 DIAGNOSIS — K644 Residual hemorrhoidal skin tags: Secondary | ICD-10-CM | POA: Diagnosis present

## 2020-03-26 DIAGNOSIS — Z7951 Long term (current) use of inhaled steroids: Secondary | ICD-10-CM

## 2020-03-26 DIAGNOSIS — G301 Alzheimer's disease with late onset: Secondary | ICD-10-CM | POA: Diagnosis present

## 2020-03-26 DIAGNOSIS — Z7901 Long term (current) use of anticoagulants: Secondary | ICD-10-CM | POA: Diagnosis not present

## 2020-03-26 DIAGNOSIS — Z79899 Other long term (current) drug therapy: Secondary | ICD-10-CM

## 2020-03-26 DIAGNOSIS — K635 Polyp of colon: Secondary | ICD-10-CM | POA: Diagnosis present

## 2020-03-26 DIAGNOSIS — I130 Hypertensive heart and chronic kidney disease with heart failure and stage 1 through stage 4 chronic kidney disease, or unspecified chronic kidney disease: Secondary | ICD-10-CM | POA: Diagnosis present

## 2020-03-26 DIAGNOSIS — I480 Paroxysmal atrial fibrillation: Secondary | ICD-10-CM | POA: Diagnosis present

## 2020-03-26 DIAGNOSIS — F0280 Dementia in other diseases classified elsewhere without behavioral disturbance: Secondary | ICD-10-CM | POA: Diagnosis present

## 2020-03-26 DIAGNOSIS — J440 Chronic obstructive pulmonary disease with acute lower respiratory infection: Secondary | ICD-10-CM | POA: Diagnosis present

## 2020-03-26 DIAGNOSIS — K6389 Other specified diseases of intestine: Secondary | ICD-10-CM | POA: Diagnosis not present

## 2020-03-26 DIAGNOSIS — R0602 Shortness of breath: Secondary | ICD-10-CM | POA: Diagnosis not present

## 2020-03-26 DIAGNOSIS — I2510 Atherosclerotic heart disease of native coronary artery without angina pectoris: Secondary | ICD-10-CM | POA: Diagnosis present

## 2020-03-26 DIAGNOSIS — I609 Nontraumatic subarachnoid hemorrhage, unspecified: Secondary | ICD-10-CM | POA: Diagnosis not present

## 2020-03-26 DIAGNOSIS — Z8673 Personal history of transient ischemic attack (TIA), and cerebral infarction without residual deficits: Secondary | ICD-10-CM

## 2020-03-26 DIAGNOSIS — Z79890 Hormone replacement therapy (postmenopausal): Secondary | ICD-10-CM | POA: Diagnosis not present

## 2020-03-26 DIAGNOSIS — K922 Gastrointestinal hemorrhage, unspecified: Secondary | ICD-10-CM | POA: Diagnosis present

## 2020-03-26 DIAGNOSIS — D62 Acute posthemorrhagic anemia: Secondary | ICD-10-CM | POA: Diagnosis present

## 2020-03-26 DIAGNOSIS — K641 Second degree hemorrhoids: Secondary | ICD-10-CM | POA: Diagnosis present

## 2020-03-26 DIAGNOSIS — R933 Abnormal findings on diagnostic imaging of other parts of digestive tract: Secondary | ICD-10-CM | POA: Diagnosis not present

## 2020-03-26 DIAGNOSIS — I739 Peripheral vascular disease, unspecified: Secondary | ICD-10-CM | POA: Diagnosis present

## 2020-03-26 DIAGNOSIS — Z20822 Contact with and (suspected) exposure to covid-19: Secondary | ICD-10-CM | POA: Diagnosis present

## 2020-03-26 DIAGNOSIS — E785 Hyperlipidemia, unspecified: Secondary | ICD-10-CM | POA: Diagnosis present

## 2020-03-26 DIAGNOSIS — D509 Iron deficiency anemia, unspecified: Secondary | ICD-10-CM | POA: Diagnosis not present

## 2020-03-26 DIAGNOSIS — Z8249 Family history of ischemic heart disease and other diseases of the circulatory system: Secondary | ICD-10-CM

## 2020-03-26 HISTORY — DX: Acute posthemorrhagic anemia: D62

## 2020-03-26 HISTORY — DX: Gastrointestinal hemorrhage, unspecified: K92.2

## 2020-03-26 LAB — RETICULOCYTES
Immature Retic Fract: 14.2 % (ref 2.3–15.9)
RBC.: 2.72 MIL/uL — ABNORMAL LOW (ref 3.87–5.11)
Retic Count, Absolute: 60.7 10*3/uL (ref 19.0–186.0)
Retic Ct Pct: 2.2 % (ref 0.4–3.1)

## 2020-03-26 LAB — CBC
HCT: 24.5 % — ABNORMAL LOW (ref 36.0–46.0)
HCT: 24.2 % — ABNORMAL LOW (ref 36.0–46.0)
Hemoglobin: 7.8 g/dL — ABNORMAL LOW (ref 12.0–15.0)
Hemoglobin: 7.6 g/dL — ABNORMAL LOW (ref 12.0–15.0)
MCH: 28.1 pg (ref 26.0–34.0)
MCH: 28.3 pg (ref 26.0–34.0)
MCHC: 31.8 g/dL (ref 30.0–36.0)
MCHC: 31.4 g/dL (ref 30.0–36.0)
MCV: 88.1 fL (ref 80.0–100.0)
MCV: 90 fL (ref 80.0–100.0)
Platelets: 173 10*3/uL (ref 150–400)
Platelets: 150 10*3/uL (ref 150–400)
RBC: 2.78 MIL/uL — ABNORMAL LOW (ref 3.87–5.11)
RBC: 2.69 MIL/uL — ABNORMAL LOW (ref 3.87–5.11)
RDW: 13.9 % (ref 11.5–15.5)
RDW: 13.9 % (ref 11.5–15.5)
WBC: 4.7 10*3/uL (ref 4.0–10.5)
WBC: 4.6 10*3/uL (ref 4.0–10.5)
nRBC: 0 % (ref 0.0–0.2)
nRBC: 0 % (ref 0.0–0.2)

## 2020-03-26 LAB — FOLATE: Folate: 32.9 ng/mL (ref >5.9)

## 2020-03-26 LAB — COMPREHENSIVE METABOLIC PANEL
ALT: 15 U/L (ref 0–44)
AST: 20 U/L (ref 15–41)
Albumin: 2.2 g/dL — ABNORMAL LOW (ref 3.5–5.0)
Alkaline Phosphatase: 63 U/L (ref 38–126)
Anion gap: 6 (ref 5–15)
BUN: 8 mg/dL (ref 8–23)
CO2: 24 mmol/L (ref 22–32)
Calcium: 8.1 mg/dL — ABNORMAL LOW (ref 8.9–10.3)
Chloride: 107 mmol/L (ref 98–111)
Creatinine, Ser: 1.11 mg/dL — ABNORMAL HIGH (ref 0.44–1.00)
GFR calc Af Amer: 52 mL/min — ABNORMAL LOW (ref >60)
GFR calc non Af Amer: 45 mL/min — ABNORMAL LOW (ref >60)
Glucose, Bld: 90 mg/dL (ref 70–99)
Potassium: 3.8 mmol/L (ref 3.5–5.1)
Sodium: 137 mmol/L (ref 135–145)
Total Bilirubin: 0.4 mg/dL (ref 0.3–1.2)
Total Protein: 4.6 g/dL — ABNORMAL LOW (ref 6.5–8.1)

## 2020-03-26 LAB — TYPE AND SCREEN
ABO/RH(D): A POS
Antibody Screen: NEGATIVE

## 2020-03-26 LAB — ABO/RH: ABO/RH(D): A POS

## 2020-03-26 LAB — PROTIME-INR
INR: 1.2 (ref 0.8–1.2)
Prothrombin Time: 15.1 seconds (ref 11.4–15.2)

## 2020-03-26 LAB — IRON AND TIBC
Iron: 30 ug/dL (ref 28–170)
Saturation Ratios: 15 % (ref 10.4–31.8)
TIBC: 203 ug/dL — ABNORMAL LOW (ref 250–450)
UIBC: 173 ug/dL

## 2020-03-26 LAB — TSH: TSH: 0.916 u[IU]/mL (ref 0.350–4.500)

## 2020-03-26 LAB — VITAMIN B12: Vitamin B-12: 993 pg/mL — ABNORMAL HIGH (ref 180–914)

## 2020-03-26 LAB — MAGNESIUM: Magnesium: 1.6 mg/dL — ABNORMAL LOW (ref 1.7–2.4)

## 2020-03-26 LAB — FERRITIN: Ferritin: 44 ng/mL (ref 11–307)

## 2020-03-26 LAB — HEMOGLOBIN AND HEMATOCRIT, BLOOD
HCT: 27.7 % — ABNORMAL LOW (ref 36.0–46.0)
Hemoglobin: 8.8 g/dL — ABNORMAL LOW (ref 12.0–15.0)

## 2020-03-26 LAB — SARS CORONAVIRUS 2 BY RT PCR (HOSPITAL ORDER, PERFORMED IN ~~LOC~~ HOSPITAL LAB): SARS Coronavirus 2: NEGATIVE

## 2020-03-26 MED ORDER — DEXTROSE-NACL 5-0.9 % IV SOLN: INTRAVENOUS | Status: DC

## 2020-03-26 MED ORDER — PANTOPRAZOLE SODIUM 40 MG IV SOLR: 40.0000 mg | Freq: Two times a day (BID) | INTRAVENOUS | Status: DC

## 2020-03-26 MED ORDER — ONDANSETRON HCL 4 MG PO TABS
4.0000 mg | ORAL_TABLET | Freq: Four times a day (QID) | ORAL | Status: DC | PRN
  Administered 2020-03-30: 4 mg via ORAL
  Filled 2020-03-26: qty 1

## 2020-03-26 MED ORDER — DIGOXIN 125 MCG PO TABS
0.1250 mg | ORAL_TABLET | Freq: Every day | ORAL | Status: DC
  Administered 2020-03-26 – 2020-03-30 (×5): 0.125 mg via ORAL
  Filled 2020-03-26 (×5): qty 1

## 2020-03-26 MED ORDER — DULOXETINE HCL 30 MG PO CPEP
30.0000 mg | ORAL_CAPSULE | Freq: Every day | ORAL | Status: DC
  Administered 2020-03-26 – 2020-03-30 (×5): 30 mg via ORAL
  Filled 2020-03-26 (×6): qty 1

## 2020-03-26 MED ORDER — MIRTAZAPINE 15 MG PO TABS
7.5000 mg | ORAL_TABLET | Freq: Every day | ORAL | Status: DC
  Administered 2020-03-26 – 2020-03-29 (×4): 7.5 mg via ORAL
  Filled 2020-03-26 (×4): qty 1

## 2020-03-26 MED ORDER — PEG-KCL-NACL-NASULF-NA ASC-C 100 G PO SOLR
0.5000 | Freq: Once | ORAL | Status: AC
  Administered 2020-03-27: 100 g via ORAL
  Filled 2020-03-26 (×2): qty 1

## 2020-03-26 MED ORDER — SODIUM CHLORIDE 0.9 % IV SOLN
80.0000 mg | Freq: Once | INTRAVENOUS | Status: AC
  Administered 2020-03-26: 80 mg via INTRAVENOUS
  Filled 2020-03-26: qty 80

## 2020-03-26 MED ORDER — LEVOTHYROXINE SODIUM 75 MCG PO TABS
37.5000 ug | ORAL_TABLET | Freq: Every day | ORAL | Status: DC
  Administered 2020-03-26 – 2020-03-30 (×5): 37.5 ug via ORAL
  Filled 2020-03-26 (×5): qty 1

## 2020-03-26 MED ORDER — SODIUM CHLORIDE 0.9 % IV SOLN
8.0000 mg/h | INTRAVENOUS | Status: DC
  Administered 2020-03-26: 8 mg/h via INTRAVENOUS
  Filled 2020-03-26: qty 80

## 2020-03-26 MED ORDER — UMECLIDINIUM BROMIDE 62.5 MCG/INH IN AEPB
1.0000 | INHALATION_SPRAY | Freq: Every day | RESPIRATORY_TRACT | Status: DC
  Administered 2020-03-26 – 2020-03-30 (×4): 1 via RESPIRATORY_TRACT
  Filled 2020-03-26: qty 7

## 2020-03-26 MED ORDER — ONDANSETRON HCL 4 MG/2ML IJ SOLN
4.0000 mg | Freq: Four times a day (QID) | INTRAMUSCULAR | Status: DC | PRN
  Administered 2020-03-29: 4 mg via INTRAVENOUS
  Filled 2020-03-26: qty 2

## 2020-03-26 MED ORDER — METOCLOPRAMIDE HCL 5 MG/ML IJ SOLN
5.0000 mg | Freq: Once | INTRAMUSCULAR | Status: AC
  Administered 2020-03-26: 5 mg via INTRAVENOUS
  Filled 2020-03-26: qty 2

## 2020-03-26 MED ORDER — PEG-KCL-NACL-NASULF-NA ASC-C 100 G PO SOLR: 1.0000 | Freq: Once | ORAL | Status: DC

## 2020-03-26 MED ORDER — ALBUTEROL SULFATE (2.5 MG/3ML) 0.083% IN NEBU: 2.5000 mg | INHALATION_SOLUTION | RESPIRATORY_TRACT | Status: DC | PRN

## 2020-03-26 MED ORDER — ROSUVASTATIN CALCIUM 20 MG PO TABS
20.0000 mg | ORAL_TABLET | Freq: Every day | ORAL | Status: DC
  Administered 2020-03-26 – 2020-03-30 (×4): 20 mg via ORAL
  Filled 2020-03-26 (×7): qty 1

## 2020-03-26 MED ORDER — DILTIAZEM HCL ER COATED BEADS 120 MG PO CP24
120.0000 mg | ORAL_CAPSULE | Freq: Every day | ORAL | Status: DC
  Administered 2020-03-26 – 2020-03-30 (×5): 120 mg via ORAL
  Filled 2020-03-26 (×6): qty 1

## 2020-03-26 MED ORDER — PANTOPRAZOLE SODIUM 40 MG PO TBEC
40.0000 mg | DELAYED_RELEASE_TABLET | Freq: Two times a day (BID) | ORAL | Status: DC
  Administered 2020-03-26 – 2020-03-30 (×8): 40 mg via ORAL
  Filled 2020-03-26 (×9): qty 1

## 2020-03-26 MED ORDER — BISACODYL 5 MG PO TBEC
20.0000 mg | DELAYED_RELEASE_TABLET | Freq: Once | ORAL | Status: AC
  Administered 2020-03-26: 20 mg via ORAL
  Filled 2020-03-26: qty 4

## 2020-03-26 MED ORDER — PANTOPRAZOLE SODIUM 40 MG PO TBEC: 40.0000 mg | DELAYED_RELEASE_TABLET | Freq: Every day | ORAL | Status: DC

## 2020-03-26 MED ORDER — PEG-KCL-NACL-NASULF-NA ASC-C 100 G PO SOLR
0.5000 | Freq: Once | ORAL | Status: AC
  Administered 2020-03-26: 100 g via ORAL

## 2020-03-26 MED ORDER — FLUTICASONE-UMECLIDIN-VILANT 100-62.5-25 MCG/INH IN AEPB: 1.0000 | INHALATION_SPRAY | Freq: Every day | RESPIRATORY_TRACT | Status: DC

## 2020-03-26 MED ORDER — SODIUM CHLORIDE 0.9 % IV BOLUS
250.0000 mL | Freq: Once | INTRAVENOUS | Status: AC
  Administered 2020-03-26: 250 mL via INTRAVENOUS

## 2020-03-26 MED ORDER — FLUTICASONE FUROATE-VILANTEROL 100-25 MCG/INH IN AEPB
1.0000 | INHALATION_SPRAY | Freq: Every day | RESPIRATORY_TRACT | Status: DC
  Administered 2020-03-26 – 2020-03-30 (×4): 1 via RESPIRATORY_TRACT
  Filled 2020-03-26: qty 28

## 2020-03-26 MED ORDER — MAGNESIUM SULFATE 4 GM/100ML IV SOLN
4.0000 g | Freq: Once | INTRAVENOUS | Status: AC
  Administered 2020-03-26: 4 g via INTRAVENOUS
  Filled 2020-03-26: qty 100

## 2020-03-26 NOTE — Plan of Care (Signed)
  Problem: Education: Goal: Knowledge of General Education information will improve Description Including pain rating scale, medication(s)/side effects and non-pharmacologic comfort measures Outcome: Progressing   

## 2020-03-26 NOTE — Plan of Care (Signed)
  Problem: Education: Goal: Knowledge of General Education information will improve Description: Including pain rating scale, medication(s)/side effects and non-pharmacologic comfort measures Outcome: Progressing   Problem: Health Behavior/Discharge Planning: Goal: Ability to manage health-related needs will improve Outcome: Progressing   Problem: Activity: Goal: Risk for activity intolerance will decrease Outcome: Progressing  Patient w/ elevated HR after getting up to bathroom, HR returning to normal with rest. Patient denies chest pain or SOB.  Problem: Nutrition: Goal: Adequate nutrition will be maintained Outcome: Progressing  Patient tolerating clear liquid diet.

## 2020-03-26 NOTE — Progress Notes (Signed)
I have seen and assessed Jennifer Osborne and I agree with Dr. Moise Boring assessment and plan.  Jennifer Osborne is a pleasant 86 year old female history of A. fib on chronic anticoagulation status post recent cardioversion for weakness, coronary artery disease, COPD, chronic kidney disease stage III who was admitted to [facility] on Mar 23, 2020 for hypocalcemia and hypokalemia and generalized weakness.  Electrolytes repleted.  Jennifer Osborne noted to have a drop in her hemoglobin from 10-7.8 with positive FOBT.  Jennifer Osborne transferred to [facility] for further evaluation for GI bleed.  Check an anemia panel.  Serial H&H.  Transfusion threshold hemoglobin <7.  Continue Protonix drip.  Continue to hold anticoagulation.  Consult with GI for further evaluation and management.  No charge.

## 2020-03-26 NOTE — H&P (Signed)
History and Physical    Jennifer Osborne [id number] DOB: [birth year] DOA: 03/26/2020  PCP: [name], NP  Patient coming from: Patient was transferred from [facility].  Chief Complaint: Weakness.  HPI: Jennifer Osborne is a 86 y.o. female with history of A. fib who underwent recent cardioversion for weakness CAD COPD chronic kidney disease stage III anemia was admitted at [facility] on Mar 23, 2020 for hypocalcemia and hypokalemia and generalized weakness.  Patient had potassium and calcium replaced.  Eventually patient's hemoglobin dropped from 10-7.8 and stool for occult blood was positive.  Patient was transferred to [facility] for GI bleed in the setting of the use of apixaban.  Patient states she has been noticing some black stool for the last 1 week.  Denies taking any NSAIDs.  Denies chest pain or shortness of breath.  Patient is afebrile.  Labs done on May 26 show hemoglobin of 7.8 creatinine 1.1 bicarb 24 potassium 3.7 platelets 203 WBC 4.9.  ED Course: Patient is a direct admit.  Review of Systems: As per HPI, rest all negative.   Past Medical History:  Diagnosis Date  . Abnormal weight loss   . Ambulatory dysfunction   . Asthma   . Atrial fibrillation ([place])   . Basilar artery stenosis   . Bile reflux gastritis   . BMI 21.0-21.9, adult   . Brain bleed ([place])    history in 2019  . Cerumen impaction   . Chronic constipation   . Chronic nausea   . COPD (chronic obstructive pulmonary disease) ([place])   . Degenerative disc disease, lumbar   . Depression with anxiety   . Diastolic CHF ([place])   . Dysphagia   . External hemorrhoids   . Frequent falls   . Generalized anxiety disorder   . GERD (gastroesophageal reflux disease)   . Hiatal hernia   . Hyperlipemia   . Hyperlipemia   . Hypothyroidism   . Insomnia   . Internal hemorrhoids   . Iron deficiency anemia   . Major depression   . Migraine   . Neurological disorder    Huntington's Similar  .  Osteoarthritis, generalized   . PAD (peripheral artery disease) ([place])   . Paroxysmal atrial fibrillation (HCC)   . PVD (peripheral vascular disease) ([place])   . Radiculopathy    left lower leg  . Sigmoid diverticulosis   . SOB (shortness of breath)   . [place] dance   . Systemic lupus erythematosus (SLE) in remission ([place])   . Urinary incontinence   . Urinary tract infection   . Vascular dementia without behavioral disturbance ([place])   . Vitamin B 12 deficiency   . Vitamin D deficiency   . Weakness     Past Surgical History:  Procedure Laterality Date  . ABDOMINAL HYSTERECTOMY    . APPENDECTOMY    . BACK SURGERY    . IR ANGIOGRAM EXTREMITY BILATERAL  01/15/2019  . IR AORTAGRAM ABDOMINAL SERIALOGRAM  01/15/2019  . IR ILIAC ART STENT INC PTA MOD SED  01/15/2019  . IR TRANSCATH PLC STENT 1ST ART NOT LE CV CAR VERT CAR  01/15/2019  . IR US GUIDE VASC ACCESS LEFT  01/15/2019  . IR US GUIDE VASC ACCESS RIGHT  01/15/2019     reports that she has never smoked. She has never used smokeless tobacco. She reports that she does not drink alcohol or use drugs.  Allergies  Allergen Reactions  . Morphine And Related Itching  Pt states she had itching, no rash  . Meperidine Nausea Only  . Pregabalin     Caused stroke    Family History  Problem Relation Age of Onset  . Heart disease Mother   . Heart attack Father     Prior to Admission medications   Medication Sig Start Date End Date Taking? Authorizing Provider  albuterol (VENTOLIN HFA) 108 (90 Base) MCG/ACT inhaler Inhale 1 puff into the lungs every 4 (four) hours as needed.    [provider]  apixaban (ELIQUIS) 2.5 MG TABS tablet Take 1 tablet (2.5 mg total) by mouth 2 (two) times daily. 02/09/20   Tobb, Kardie, DO  Calcium Carb-Cholecalciferol (CALCIUM EXTRA D3) 500-600 MG-UNIT TABS Take 1 tablet by mouth 2 (two) times daily.    [provider]  digoxin (LANOXIN) 0.125 MG tablet Take 1 tablet (0.125 mg total) by  mouth daily. 02/09/20   Tobb, Kardie, DO  diltiazem (DILACOR XR) 120 MG 24 hr capsule Take 120 mg by mouth in the morning and at bedtime.    [provider]  DULoxetine (CYMBALTA) 30 MG capsule Take 30 mg by mouth daily. Take along with 60mg  for a total daily dose of 90mg  daily.    [provider]  DULoxetine (CYMBALTA) 60 MG capsule Take 60 mg by mouth daily. Take along with 30mg  for a total daily dose of 90mg  daily.    [provider]  Fluticasone-Umeclidin-Vilant (TRELEGY ELLIPTA) 100-62.5-25 MCG/INH AEPB Inhale 1 puff into the lungs daily.    [provider]  levothyroxine (SYNTHROID) 25 MCG tablet Take 37.5 mcg by mouth daily before breakfast.    [provider]  lisinopril (ZESTRIL) 2.5 MG tablet Take 2.5 mg by mouth daily.    [provider]  mirtazapine (REMERON) 7.5 MG tablet Take 7.5 mg by mouth at bedtime.    [provider]  Multiple Vitamins-Minerals (ALIVE MULTI-VITAMIN PO) Take by mouth daily.    [provider]  pantoprazole (PROTONIX) 40 MG tablet Take 40 mg by mouth daily.     [provider]  rosuvastatin (CRESTOR) 20 MG tablet Take 20 mg by mouth daily.    [provider]  spironolactone (ALDACTONE) 25 MG tablet Take 25 mg by mouth daily.    [provider]  sucralfate (CARAFATE) 1 g tablet Take 1 g by mouth 2 (two) times daily before a meal.    [provider]  tetrabenazine (XENAZINE) 12.5 MG tablet Take 12.5 mg by mouth daily.    [provider]    Physical Exam: Constitutional: Moderately built and nourished. Vitals:   03/26/20 0304 03/26/20 0328 03/26/20 0413 03/26/20 0443  BP: 113/64   (!) 101/38  Pulse: 66   72  Resp: 16   16  Temp: 98.6 F (37 C)   98.6 F (37 C)  TempSrc: Oral   Oral  SpO2: 96%   95%  Weight:  58 kg 58 kg   Height:  5\' 4"  (1.626 m) 5\' 4"  (1.626 m)    Eyes: Anicteric no pallor. ENMT: No discharge from the ears eyes nose or  mouth. Neck: No mass felt.  No neck rigidity. Respiratory: No rhonchi or crepitations. Cardiovascular: S1-S2 heard. Abdomen: Soft nontender bowel sounds present. Musculoskeletal: No edema. Skin: No rash. Neurologic: Alert awake oriented time place and person.  Moves all extremities. Psychiatric: Appears normal but normal affect.   Labs on Admission: I have personally reviewed following labs and imaging studies  CBC: No  results for input(s): WBC, NEUTROABS, HGB, HCT, MCV, PLT in the last 168 hours. Basic Metabolic Panel: No results for input(s): NA, K, CL, CO2, GLUCOSE, BUN, CREATININE, CALCIUM, MG, PHOS in the last 168 hours. GFR: Estimated Creatinine Clearance: 17 mL/min (A) (by C-G formula based on SCr of 2.05 mg/dL (H)). Liver Function Tests: No results for input(s): AST, ALT, ALKPHOS, BILITOT, PROT, ALBUMIN in the last 168 hours. No results for input(s): LIPASE, AMYLASE in the last 168 hours. No results for input(s): AMMONIA in the last 168 hours. Coagulation Profile: No results for input(s): INR, PROTIME in the last 168 hours. Cardiac Enzymes: No results for input(s): CKTOTAL, CKMB, CKMBINDEX, TROPONINI in the last 168 hours. BNP (last 3 results) No results for input(s): PROBNP in the last 8760 hours. HbA1C: No results for input(s): HGBA1C in the last 72 hours. CBG: No results for input(s): GLUCAP in the last 168 hours. Lipid Profile: No results for input(s): CHOL, HDL, LDLCALC, TRIG, CHOLHDL, LDLDIRECT in the last 72 hours. Thyroid Function Tests: No results for input(s): TSH, T4TOTAL, FREET4, T3FREE, THYROIDAB in the last 72 hours. Anemia Panel: No results for input(s): VITAMINB12, FOLATE, FERRITIN, TIBC, IRON, RETICCTPCT in the last 72 hours. Urine analysis: No results found for: COLORURINE, APPEARANCEUR, LABSPEC, PHURINE, GLUCOSEU, HGBUR, BILIRUBINUR, KETONESUR, PROTEINUR, UROBILINOGEN, NITRITE, LEUKOCYTESUR Sepsis  Labs: @LABRCNTIP (procalcitonin:4,lacticidven:4) )No results found for this or any previous visit (from the past 240 hour(s)).   Radiological Exams on Admission: No results found.   Assessment/Plan Principal Problem:   Acute GI bleeding Active Problems:   Chronic coronary artery disease   Late onset Alzheimer's disease without behavioral disturbance (HCC)   Paroxysmal atrial fibrillation (HCC)   Subarachnoid hemorrhage (HCC)   Essential hypertension   Acute blood loss anemia    1. Acute GI bleeding in the setting of using Eliquis -we will keep patient on Protonix consult GI follow CBC transfuse if hemoglobin less than 7.  Discussed with patient about holding Eliquis and its risks patient agrees to holding Eliquis in the setting of GI bleed. 2. Acute blood loss anemia follow CBC.  Transfuse if hemoglobin less than 7. 3. History of atrial fibrillation recent cardioversion still in A. fib.  EKG pending.  Patient is on beta-blockers which we will continue holding apixaban in the setting of GI bleed patient agrees to hold. 4. History of CAD denies any chest pain. 5. Hypothyroidism on Synthroid. 6. Chronic kidney disease stage III labs pending. 7. Previous history of subarachnoid hemorrhage. 8. Recently admitted at [facility] for electrolyte imbalance follow metabolic panel.  Given that patient has acute GI bleed in the setting of anticoagulation will need close monitoring for any further deterioration and persistent GI bleed will need inpatient status.  All labs including Covid test is pending.   DVT prophylaxis: SCDs.  Patient has GI bleed holding anticoagulation. Code Status: Full code. Family Communication: Discussed with patient. Disposition Plan: To be determined. Consults called: None. Admission status: Inpatient.   Rise Patience MD [facility] Pager [phone].  If 7PM-7AM, please contact night-coverage [email or web address] Password [facility]  03/26/2020,  4:55 AM

## 2020-03-26 NOTE — H&P (View-Only) (Signed)
[facility] Consult: 9:28 AM 03/26/2020  LOS: 0 days    Referring Provider: Dr. Grandville Silos. Primary Care Physician:  [name], NP Primary Gastroenterologist: Althia Forts.    Reason for Consultation: Weakness, Hb 7.8.   HPI: Jennifer Osborne is a 86 y.o. female.  Hx atrial fibrillation with RVR. CKD. COPD. CAD. IDA. Lupus "in remission". Subarachnoid hemorrhage 2018. 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 Bile reflux gastritis. Sigmoid diverticulitis. Dysphagia. Hiatal hernia.  Cardioversion 03/16/20 for atrial fibrillation, RVR at [facility]. Chronic Eliquis before procedure and for at least 4 weeks after the procedure.. Noticing dark stool for the last week. Presented to [facility] 3 days ago with weakness. Found to have hypokalemia, hypocalcemia which were addressed. Hb 10 on 25th >> 7. 8 within 24 hours, FOBT positive. Hb 10.8, close to 3 weeks ago. MCV 90. INR 1.2. BUN, potassium, sodium all normal. Creatinine has actually improved since earlier in the month. Iron, ferritin, iron sats normal. TIBC reduced. B12, folate levels normal.  The stools are dark in color, not black, not melenic.  They are formed.  Having these daily.  Also a longer history of intermittent minor BPR with wiping after BM.  No abdominal pain.  No dysphagia.  For at least a few or several weeks or longer she has had diminished appetite but does not know if she feels lost weight.  Charting mentions chronic nausea w/o vomitting but pt denies nausea, says she is just is not hungry. Patient's memory does not serve her well.  She thinks she may have had a colonoscopy and is more sure that she has had upper endoscopy but timing, whereabouts, findings unknown.  Patient is not aware of any colon cancer, ulcer disease, GI  bleed, anemia in her family but again her memory is not great Lives with her daughter, another daughter lives very close by.    Past Medical History:  Diagnosis Date  . Abnormal weight loss   . Ambulatory dysfunction   . Asthma   . Atrial fibrillation ([place])   . Basilar artery stenosis   . Bile reflux gastritis   . BMI 21.0-21.9, adult   . Brain bleed ([place])    history in 2019  . Cerumen impaction   . Chronic constipation   . Chronic nausea   . COPD (chronic obstructive pulmonary disease) ([place])   . Degenerative disc disease, lumbar   . Depression with anxiety   . Diastolic CHF ([place])   . Dysphagia   . External hemorrhoids   . Frequent falls   . Generalized anxiety disorder   . GERD (gastroesophageal reflux disease)   . Hiatal hernia   . Hyperlipemia   . Hyperlipemia   . Hypothyroidism   . Insomnia   . Internal hemorrhoids   . Iron deficiency anemia   . Major depression   . Migraine   . Neurological disorder    Huntington's Similar  . Osteoarthritis, generalized   . PAD (peripheral artery disease) ([place])   . Paroxysmal atrial fibrillation (HCC)   . PVD (peripheral vascular disease) (  [place])   . Radiculopathy    left lower leg  . Sigmoid diverticulosis   . SOB (shortness of breath)   . [place] dance   . Systemic lupus erythematosus (SLE) in remission ([place])   . Urinary incontinence   . Urinary tract infection   . Vascular dementia without behavioral disturbance ([place])   . Vitamin B 12 deficiency   . Vitamin D deficiency   . Weakness     Past Surgical History:  Procedure Laterality Date  . ABDOMINAL HYSTERECTOMY    . APPENDECTOMY    . BACK SURGERY    . IR ANGIOGRAM EXTREMITY BILATERAL  01/15/2019  . IR AORTAGRAM ABDOMINAL SERIALOGRAM  01/15/2019  . IR ILIAC ART STENT INC PTA MOD SED  01/15/2019  . IR TRANSCATH PLC STENT 1ST ART NOT LE CV CAR VERT CAR  01/15/2019  . IR US GUIDE VASC ACCESS LEFT  01/15/2019  . IR US GUIDE VASC ACCESS RIGHT  01/15/2019    Prior to  Admission medications   Medication Sig Start Date End Date Taking? Authorizing Provider  albuterol (VENTOLIN HFA) 108 (90 Base) MCG/ACT inhaler Inhale 1 puff into the lungs every 4 (four) hours as needed.    [provider]  apixaban (ELIQUIS) 2.5 MG TABS tablet Take 1 tablet (2.5 mg total) by mouth 2 (two) times daily. 02/09/20   Tobb, Kardie, DO  Calcium Carb-Cholecalciferol (CALCIUM EXTRA D3) 500-600 MG-UNIT TABS Take 1 tablet by mouth 2 (two) times daily.    [provider]  digoxin (LANOXIN) 0.125 MG tablet Take 1 tablet (0.125 mg total) by mouth daily. 02/09/20   Tobb, Kardie, DO  diltiazem (DILACOR XR) 120 MG 24 hr capsule Take 120 mg by mouth in the morning and at bedtime.    [provider]  DULoxetine (CYMBALTA) 30 MG capsule Take 30 mg by mouth daily. Take along with 60mg  for a total daily dose of 90mg  daily.    [provider]  DULoxetine (CYMBALTA) 60 MG capsule Take 60 mg by mouth daily. Take along with 30mg  for a total daily dose of 90mg  daily.    [provider]  Fluticasone-Umeclidin-Vilant (TRELEGY ELLIPTA) 100-62.5-25 MCG/INH AEPB Inhale 1 puff into the lungs daily.    [provider]  levothyroxine (SYNTHROID) 25 MCG tablet Take 37.5 mcg by mouth daily before breakfast.    [provider]  lisinopril (ZESTRIL) 2.5 MG tablet Take 2.5 mg by mouth daily.    [provider]  mirtazapine (REMERON) 7.5 MG tablet Take 7.5 mg by mouth at bedtime.    [provider]  Multiple Vitamins-Minerals (ALIVE MULTI-VITAMIN PO) Take by mouth daily.    [provider]  pantoprazole (PROTONIX) 40 MG tablet Take 40 mg by mouth daily.     [provider]  rosuvastatin (CRESTOR) 20 MG tablet Take 20 mg by mouth daily.    [provider]  spironolactone (ALDACTONE) 25 MG tablet Take 25 mg by mouth daily.    [provider]  sucralfate (CARAFATE) 1 g tablet Take 1 g by mouth 2 (two) times  daily before a meal.    [provider]  tetrabenazine (XENAZINE) 12.5 MG tablet Take 12.5 mg by mouth daily.    [provider]    Scheduled Meds: . digoxin  0.125 mg Oral Daily  . diltiazem  120 mg Oral Daily  . DULoxetine  30 mg Oral Daily  . fluticasone furoate-vilanterol  1 puff Inhalation Daily   And  .  umeclidinium bromide  1 puff Inhalation Daily  . levothyroxine  37.5 mcg Oral QAC breakfast  . mirtazapine  7.5 mg Oral QHS  . [START ON 03/29/2020] pantoprazole  40 mg Intravenous Q12H  . rosuvastatin  20 mg Oral Daily   Infusions: . dextrose 5 % and 0.9% NaCl    . magnesium sulfate bolus IVPB    . pantoprozole (PROTONIX) infusion 8 mg/hr (03/26/20 0553)  . sodium chloride     PRN Meds: albuterol, ondansetron **OR** ondansetron (ZOFRAN) IV   Allergies as of 03/25/2020 - Review Complete 03/08/2020  Allergen Reaction Noted  . Morphine and related Itching 01/15/2019  . Meperidine Nausea Only 04/07/2015  . Pregabalin  04/07/2015    Family History  Problem Relation Age of Onset  . Heart disease Mother   . Heart attack Father     Social History   Socioeconomic History  . Marital status: Married    Spouse name: Not on file  . Number of children: 5  . Years of education: 60  . Highest education level: High school graduate  Occupational History  . Occupation: Retired  Tobacco Use  . Smoking status: Never Smoker  . Smokeless tobacco: Never Used  Substance and Sexual Activity  . Alcohol use: Never  . Drug use: Never  . Sexual activity: Not on file  Other Topics Concern  . Not on file  Social History Narrative   Lives at home with daughter.   Right-handed.   3-4 cups caffeine per day (Coke).   Social Determinants of Health   Financial Resource Strain:   . Difficulty of Paying Living Expenses:   Food Insecurity:   . Worried About Charity fundraiser in the Last Year:   . Arboriculturist in the Last Year:   Transportation Needs:   . Consulting civil engineer (Medical):   Marland Kitchen Lack of Transportation (Non-Medical):   Physical Activity:   . Days of Exercise per Week:   . Minutes of Exercise per Session:   Stress:   . Feeling of Stress :   Social Connections:   . Frequency of Communication with Friends and Family:   . Frequency of Social Gatherings with Friends and Family:   . Attends Religious Services:   . Active Member of Clubs or Organizations:   . Attends Archivist Meetings:   Marland Kitchen Marital Status:   Intimate Partner Violence:   . Fear of Current or Ex-Partner:   . Emotionally Abused:   Marland Kitchen Physically Abused:   . Sexually Abused:     REVIEW OF SYSTEMS: Constitutional: Weakness.  Normally patient is able to walk shorter till medium length distances without problems. ENT:  No nose bleeds Pulm: No shortness of breath. CV:  No palpitations, no LE edema.  GU:  No hematuria, no frequency GI: See HPI Heme: Denies unusual bleeding or bruising Transfusions: None Neuro:  No headaches, no peripheral tingling or numbness.  No syncope, no seizures. Derm:  No itching, no rash or sores.  Endocrine:  No sweats or chills.  No polyuria or dysuria Immunization: She is not sure if she has been vaccinated for Covid. Travel:  None beyond local counties in last few months.    PHYSICAL EXAM: Vital signs in last 24 hours: Vitals:   03/26/20 0443 03/26/20 0757  BP: (!) 101/38   Pulse: 72 95  Resp: 16 16  Temp: 98.6 F (37 C)   SpO2: 95% 95%   Wt Readings from Last 3  Encounters:  03/26/20 58 kg  03/08/20 58.1 kg  02/09/20 58.1 kg    General: Pleasant, looks well.  Comfortable. Head: No facial asymmetry or swelling.  No signs of head trauma. Eyes: No scleral icterus, no conjunctival pallor.  EOMI Ears: No hearing loss Nose: No congestion or discharge Mouth: Moist, clear, pink oropharynx.  Tongue midline.  Dentures in place. Neck: No JVD, masses, thyromegaly, swelling. Lungs: Diminished at the bases but clear.  No  labored breathing or cough. Heart: Irregular/irregular.  A. fib with rate 116 currently on monitor.  No MRG.  S1, S2 present. Abdomen: Soft, nontender.  Bowel sounds active.  No HSM, bruits, hernias, masses..   Rectal: No masses.  External, nonbleeding, nonthrombosed hemorrhoids.  No stool or blood on exam glove. Musc/Skeltl: No joint redness, swelling or gross deformity. Extremities: No CCE. Neurologic: Alert.  Oriented to hospital, time, self.  Poor recall of medical history, dates, details.  No tremors.  Full limb strength. Skin: No rashes, no sores, no telangiectasia Nodes: No cervical adenopathy Psych: Pleasant, cooperative, fluid speech.  Intake/Output from previous day: 05/27 0701 - 05/28 0700 In: 219.7 [I.V.:123.1; IV Piggyback:96.6] Out: -  Intake/Output this shift: No intake/output data recorded.  LAB RESULTS: Recent Labs    03/26/20 0533 03/26/20 0813  WBC 4.7 4.6  HGB 7.8* 7.6*  HCT 24.5* 24.2*  PLT 173 150   BMET Lab Results  Component Value Date   NA 137 03/26/2020   NA 138 03/08/2020   NA 139 02/09/2020   K 3.8 03/26/2020   K 4.9 03/08/2020   K 4.6 02/09/2020   CL 107 03/26/2020   CL 101 03/08/2020   CL 101 02/09/2020   CO2 24 03/26/2020   CO2 21 03/08/2020   CO2 22 02/09/2020   GLUCOSE 90 03/26/2020   GLUCOSE 91 03/08/2020   GLUCOSE 76 02/09/2020   BUN 8 03/26/2020   BUN 19 03/08/2020   BUN 18 02/09/2020   CREATININE 1.11 (H) 03/26/2020   CREATININE 2.05 (H) 03/08/2020   CREATININE 1.44 (H) 02/09/2020   CALCIUM 8.1 (L) 03/26/2020   CALCIUM 8.7 03/08/2020   CALCIUM 9.5 02/09/2020   LFT Recent Labs    03/26/20 0533  PROT 4.6*  ALBUMIN 2.2*  AST 20  ALT 15  ALKPHOS 63  BILITOT 0.4   PT/INR Lab Results  Component Value Date   INR 1.2 03/26/2020   INR 1.0 01/15/2019      RADIOLOGY STUDIES: No results found.    IMPRESSION:   *   FOBT positive anemia R/o ulcer, AVMs, neoplasia.    *  Anorexia,  ? Nausea (pt endorses  former, denies later).    *     Eliquis therapy pre and post cardioversion within the last 3 weeks.  Currently on hold.  Date of last dose unknown but probably before 5/25  *    A. fib.  Current rate in 1 teens.  Recent, exact date unknown but within the last 3 weeks, cardioversion.  Chronic Eliquis is on hold.  Last Eliquis unknown but probably on her before 5/25 when she presented to [facility] ED.  PLAN:     *    I do not see an indication for Protonix drip so stopping this and converting to oral Protonix.  *     Clear liquid diet.    *   See orders for bowel prep.  Colon/egd tmrw.     Azucena Freed  03/26/2020, 9:28 AM Phone [phone]     

## 2020-03-26 NOTE — Anesthesia Preprocedure Evaluation (Addendum)
Anesthesia Evaluation  Patient identified by MRN, date of birth, ID band Patient awake    Reviewed: Allergy & Precautions, NPO status , Patient's Chart, lab work & pertinent test results  History of Anesthesia Complications Negative for: history of anesthetic complications  Airway Mallampati: II  TM Distance: >3 FB Neck ROM: Full    Dental  (+) Edentulous Upper, Edentulous Lower   Pulmonary asthma , COPD,    Pulmonary exam normal        Cardiovascular hypertension, Pt. on medications + CAD and + Peripheral Vascular Disease  + dysrhythmias Atrial Fibrillation  Rhythm:Irregular Rate:Normal     Neuro/Psych  Headaches, PSYCHIATRIC DISORDERS Anxiety Depression Dementia  Neuromuscular disease (radiculopathy)    GI/Hepatic Neg liver ROS, hiatal hernia, GERD  Medicated and Controlled,  Endo/Other  Hypothyroidism   Renal/GU negative Renal ROS     Musculoskeletal  (+) Arthritis ,   Abdominal   Peds  Hematology  (+) Blood dyscrasia, anemia ,  On eliquis    Anesthesia Other Findings SLE Covid neg 5/28   Reproductive/Obstetrics                            Anesthesia Physical Anesthesia Plan  ASA: III  Anesthesia Plan: MAC   Post-op Pain Management:    Induction: Intravenous  PONV Risk Score and Plan: 2 and Propofol infusion and Treatment may vary due to age or medical condition  Airway Management Planned: Nasal Cannula and Natural Airway  Additional Equipment: None  Intra-op Plan:   Post-operative Plan:   Informed Consent: I have reviewed the patients History and Physical, chart, labs and discussed the procedure including the risks, benefits and alternatives for the proposed anesthesia with the patient or authorized representative who has indicated his/her understanding and acceptance.       Plan Discussed with: CRNA and Anesthesiologist  Anesthesia Plan Comments: (???DNR  Status)       Anesthesia Quick Evaluation

## 2020-03-26 NOTE — Progress Notes (Signed)
Patient admitted to 6N22 from [place] and oriented x4. Vital signs normal. Denies pain at this time. Oriented to room and remote. Notified admitting MD of patient's arrival to floor.

## 2020-03-26 NOTE — Progress Notes (Signed)
[facility] Consult: 9:28 AM 03/26/2020  LOS: 0 days    Referring Provider: Dr. Grandville Silos. Primary Care Physician:  [name], NP Primary Gastroenterologist: Althia Forts.    Reason for Consultation: Weakness, Hb 7.8.   HPI: Jennifer Osborne is a 86 y.o. female.  Hx atrial fibrillation with RVR. CKD. COPD. CAD. IDA. Lupus "in remission". Subarachnoid hemorrhage 2018. 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 Bile reflux gastritis. Sigmoid diverticulitis. Dysphagia. Hiatal hernia.  Cardioversion 03/16/20 for atrial fibrillation, RVR at [facility]. Chronic Eliquis before procedure and for at least 4 weeks after the procedure.. Noticing dark stool for the last week. Presented to [facility] 3 days ago with weakness. Found to have hypokalemia, hypocalcemia which were addressed. Hb 10 on 25th >> 7. 8 within 24 hours, FOBT positive. Hb 10.8, close to 3 weeks ago. MCV 90. INR 1.2. BUN, potassium, sodium all normal. Creatinine has actually improved since earlier in the month. Iron, ferritin, iron sats normal. TIBC reduced. B12, folate levels normal.  The stools are dark in color, not black, not melenic.  They are formed.  Having these daily.  Also a longer history of intermittent minor BPR with wiping after BM.  No abdominal pain.  No dysphagia.  For at least a few or several weeks or longer she has had diminished appetite but does not know if she feels lost weight.  Charting mentions chronic nausea w/o vomitting but pt denies nausea, says she is just is not hungry. Patient's memory does not serve her well.  She thinks she may have had a colonoscopy and is more sure that she has had upper endoscopy but timing, whereabouts, findings unknown.  Patient is not aware of any colon cancer, ulcer disease, GI  bleed, anemia in her family but again her memory is not great Lives with her daughter, another daughter lives very close by.    Past Medical History:  Diagnosis Date  . Abnormal weight loss   . Ambulatory dysfunction   . Asthma   . Atrial fibrillation ([place])   . Basilar artery stenosis   . Bile reflux gastritis   . BMI 21.0-21.9, adult   . Brain bleed ([place])    history in 2019  . Cerumen impaction   . Chronic constipation   . Chronic nausea   . COPD (chronic obstructive pulmonary disease) ([place])   . Degenerative disc disease, lumbar   . Depression with anxiety   . Diastolic CHF ([place])   . Dysphagia   . External hemorrhoids   . Frequent falls   . Generalized anxiety disorder   . GERD (gastroesophageal reflux disease)   . Hiatal hernia   . Hyperlipemia   . Hyperlipemia   . Hypothyroidism   . Insomnia   . Internal hemorrhoids   . Iron deficiency anemia   . Major depression   . Migraine   . Neurological disorder    Huntington's Similar  . Osteoarthritis, generalized   . PAD (peripheral artery disease) ([place])   . Paroxysmal atrial fibrillation ([facility])   . PVD (peripheral vascular disease) (  [place])   . Radiculopathy    left lower leg  . Sigmoid diverticulosis   . SOB (shortness of breath)   . [place] dance   . Systemic lupus erythematosus (SLE) in remission ([place])   . Urinary incontinence   . Urinary tract infection   . Vascular dementia without behavioral disturbance ([place])   . Vitamin B 12 deficiency   . Vitamin D deficiency   . Weakness     Past Surgical History:  Procedure Laterality Date  . ABDOMINAL HYSTERECTOMY    . APPENDECTOMY    . BACK SURGERY    . IR ANGIOGRAM EXTREMITY BILATERAL  01/15/2019  . IR AORTAGRAM ABDOMINAL SERIALOGRAM  01/15/2019  . IR ILIAC ART STENT INC PTA MOD SED  01/15/2019  . IR TRANSCATH PLC STENT 1ST ART NOT LE CV CAR VERT CAR  01/15/2019  . IR US GUIDE VASC ACCESS LEFT  01/15/2019  . IR US GUIDE VASC ACCESS RIGHT  01/15/2019    Prior to  Admission medications   Medication Sig Start Date End Date Taking? Authorizing Provider  albuterol (VENTOLIN HFA) 108 (90 Base) MCG/ACT inhaler Inhale 1 puff into the lungs every 4 (four) hours as needed.    [provider]  apixaban (ELIQUIS) 2.5 MG TABS tablet Take 1 tablet (2.5 mg total) by mouth 2 (two) times daily. 02/09/20   Tobb, Kardie, DO  Calcium Carb-Cholecalciferol (CALCIUM EXTRA D3) 500-600 MG-UNIT TABS Take 1 tablet by mouth 2 (two) times daily.    [provider]  digoxin (LANOXIN) 0.125 MG tablet Take 1 tablet (0.125 mg total) by mouth daily. 02/09/20   Tobb, Kardie, DO  diltiazem (DILACOR XR) 120 MG 24 hr capsule Take 120 mg by mouth in the morning and at bedtime.    [provider]  DULoxetine (CYMBALTA) 30 MG capsule Take 30 mg by mouth daily. Take along with 60mg  for a total daily dose of 90mg  daily.    [provider]  DULoxetine (CYMBALTA) 60 MG capsule Take 60 mg by mouth daily. Take along with 30mg  for a total daily dose of 90mg  daily.    [provider]  Fluticasone-Umeclidin-Vilant (TRELEGY ELLIPTA) 100-62.5-25 MCG/INH AEPB Inhale 1 puff into the lungs daily.    [provider]  levothyroxine (SYNTHROID) 25 MCG tablet Take 37.5 mcg by mouth daily before breakfast.    [provider]  lisinopril (ZESTRIL) 2.5 MG tablet Take 2.5 mg by mouth daily.    [provider]  mirtazapine (REMERON) 7.5 MG tablet Take 7.5 mg by mouth at bedtime.    [provider]  Multiple Vitamins-Minerals (ALIVE MULTI-VITAMIN PO) Take by mouth daily.    [provider]  pantoprazole (PROTONIX) 40 MG tablet Take 40 mg by mouth daily.     [provider]  rosuvastatin (CRESTOR) 20 MG tablet Take 20 mg by mouth daily.    [provider]  spironolactone (ALDACTONE) 25 MG tablet Take 25 mg by mouth daily.    [provider]  sucralfate (CARAFATE) 1 g tablet Take 1 g by mouth 2 (two) times  daily before a meal.    [provider]  tetrabenazine (XENAZINE) 12.5 MG tablet Take 12.5 mg by mouth daily.    [provider]    Scheduled Meds: . digoxin  0.125 mg Oral Daily  . diltiazem  120 mg Oral Daily  . DULoxetine  30 mg Oral Daily  . fluticasone furoate-vilanterol  1 puff Inhalation Daily   And  .  umeclidinium bromide  1 puff Inhalation Daily  . levothyroxine  37.5 mcg Oral QAC breakfast  . mirtazapine  7.5 mg Oral QHS  . [START ON 03/29/2020] pantoprazole  40 mg Intravenous Q12H  . rosuvastatin  20 mg Oral Daily   Infusions: . dextrose 5 % and 0.9% NaCl    . magnesium sulfate bolus IVPB    . pantoprozole (PROTONIX) infusion 8 mg/hr (03/26/20 0553)  . sodium chloride     PRN Meds: albuterol, ondansetron **OR** ondansetron (ZOFRAN) IV   Allergies as of 03/25/2020 - Review Complete 03/08/2020  Allergen Reaction Noted  . Morphine and related Itching 01/15/2019  . Meperidine Nausea Only 04/07/2015  . Pregabalin  04/07/2015    Family History  Problem Relation Age of Onset  . Heart disease Mother   . Heart attack Father     Social History   Socioeconomic History  . Marital status: Married    Spouse name: Not on file  . Number of children: 5  . Years of education: 19  . Highest education level: High school graduate  Occupational History  . Occupation: Retired  Tobacco Use  . Smoking status: Never Smoker  . Smokeless tobacco: Never Used  Substance and Sexual Activity  . Alcohol use: Never  . Drug use: Never  . Sexual activity: Not on file  Other Topics Concern  . Not on file  Social History Narrative   Lives at home with daughter.   Right-handed.   3-4 cups caffeine per day (Coke).   Social Determinants of Health   Financial Resource Strain:   . Difficulty of Paying Living Expenses:   Food Insecurity:   . Worried About Charity fundraiser in the Last Year:   . Arboriculturist in the Last Year:   Transportation Needs:   . Consulting civil engineer (Medical):   Marland Kitchen Lack of Transportation (Non-Medical):   Physical Activity:   . Days of Exercise per Week:   . Minutes of Exercise per Session:   Stress:   . Feeling of Stress :   Social Connections:   . Frequency of Communication with Friends and Family:   . Frequency of Social Gatherings with Friends and Family:   . Attends Religious Services:   . Active Member of Clubs or Organizations:   . Attends Archivist Meetings:   Marland Kitchen Marital Status:   Intimate Partner Violence:   . Fear of Current or Ex-Partner:   . Emotionally Abused:   Marland Kitchen Physically Abused:   . Sexually Abused:     REVIEW OF SYSTEMS: Constitutional: Weakness.  Normally patient is able to walk shorter till medium length distances without problems. ENT:  No nose bleeds Pulm: No shortness of breath. CV:  No palpitations, no LE edema.  GU:  No hematuria, no frequency GI: See HPI Heme: Denies unusual bleeding or bruising Transfusions: None Neuro:  No headaches, no peripheral tingling or numbness.  No syncope, no seizures. Derm:  No itching, no rash or sores.  Endocrine:  No sweats or chills.  No polyuria or dysuria Immunization: She is not sure if she has been vaccinated for Covid. Travel:  None beyond local counties in last few months.    PHYSICAL EXAM: Vital signs in last 24 hours: Vitals:   03/26/20 0443 03/26/20 0757  BP: (!) 101/38   Pulse: 72 95  Resp: 16 16  Temp: 98.6 F (37 C)   SpO2: 95% 95%   Wt Readings from Last 3  Encounters:  03/26/20 58 kg  03/08/20 58.1 kg  02/09/20 58.1 kg    General: Pleasant, looks well.  Comfortable. Head: No facial asymmetry or swelling.  No signs of head trauma. Eyes: No scleral icterus, no conjunctival pallor.  EOMI Ears: No hearing loss Nose: No congestion or discharge Mouth: Moist, clear, pink oropharynx.  Tongue midline.  Dentures in place. Neck: No JVD, masses, thyromegaly, swelling. Lungs: Diminished at the bases but clear.  No  labored breathing or cough. Heart: Irregular/irregular.  A. fib with rate 116 currently on monitor.  No MRG.  S1, S2 present. Abdomen: Soft, nontender.  Bowel sounds active.  No HSM, bruits, hernias, masses..   Rectal: No masses.  External, nonbleeding, nonthrombosed hemorrhoids.  No stool or blood on exam glove. Musc/Skeltl: No joint redness, swelling or gross deformity. Extremities: No CCE. Neurologic: Alert.  Oriented to hospital, time, self.  Poor recall of medical history, dates, details.  No tremors.  Full limb strength. Skin: No rashes, no sores, no telangiectasia Nodes: No cervical adenopathy Psych: Pleasant, cooperative, fluid speech.  Intake/Output from previous day: 05/27 0701 - 05/28 0700 In: 219.7 [I.V.:123.1; IV Piggyback:96.6] Out: -  Intake/Output this shift: No intake/output data recorded.  LAB RESULTS: Recent Labs    03/26/20 0533 03/26/20 0813  WBC 4.7 4.6  HGB 7.8* 7.6*  HCT 24.5* 24.2*  PLT 173 150   BMET Lab Results  Component Value Date   NA 137 03/26/2020   NA 138 03/08/2020   NA 139 02/09/2020   K 3.8 03/26/2020   K 4.9 03/08/2020   K 4.6 02/09/2020   CL 107 03/26/2020   CL 101 03/08/2020   CL 101 02/09/2020   CO2 24 03/26/2020   CO2 21 03/08/2020   CO2 22 02/09/2020   GLUCOSE 90 03/26/2020   GLUCOSE 91 03/08/2020   GLUCOSE 76 02/09/2020   BUN 8 03/26/2020   BUN 19 03/08/2020   BUN 18 02/09/2020   CREATININE 1.11 (H) 03/26/2020   CREATININE 2.05 (H) 03/08/2020   CREATININE 1.44 (H) 02/09/2020   CALCIUM 8.1 (L) 03/26/2020   CALCIUM 8.7 03/08/2020   CALCIUM 9.5 02/09/2020   LFT Recent Labs    03/26/20 0533  PROT 4.6*  ALBUMIN 2.2*  AST 20  ALT 15  ALKPHOS 63  BILITOT 0.4   PT/INR Lab Results  Component Value Date   INR 1.2 03/26/2020   INR 1.0 01/15/2019      RADIOLOGY STUDIES: No results found.    IMPRESSION:   *   FOBT positive anemia R/o ulcer, AVMs, neoplasia.    *  Anorexia,  ? Nausea (pt endorses  former, denies later).    *     Eliquis therapy pre and post cardioversion within the last 3 weeks.  Currently on hold.  Date of last dose unknown but probably before 5/25  *    A. fib.  Current rate in 1 teens.  Recent, exact date unknown but within the last 3 weeks, cardioversion.  Chronic Eliquis is on hold.  Last Eliquis unknown but probably on her before 5/25 when she presented to [facility] ED.  PLAN:     *    I do not see an indication for Protonix drip so stopping this and converting to oral Protonix.  *     Clear liquid diet.    *   See orders for bowel prep.  Colon/egd tmrw.     Azucena Freed  03/26/2020, 9:28 AM Phone [phone]     

## 2020-03-27 ENCOUNTER — Encounter (HOSPITAL_COMMUNITY)
Admission: AD | Disposition: A | Discharge status: Home Health | Payer: Self-pay | Source: Other Acute Inpatient Hospital | Attending: Internal Medicine

## 2020-03-27 ENCOUNTER — Inpatient Hospital Stay (HOSPITAL_COMMUNITY): Payer: No Typology Code available for payment source | Admitting: Anesthesiology

## 2020-03-27 ENCOUNTER — Encounter (HOSPITAL_COMMUNITY): Payer: Self-pay | Admitting: Internal Medicine

## 2020-03-27 ENCOUNTER — Inpatient Hospital Stay (HOSPITAL_COMMUNITY): Payer: No Typology Code available for payment source

## 2020-03-27 DIAGNOSIS — J189 Pneumonia, unspecified organism: Secondary | ICD-10-CM

## 2020-03-27 DIAGNOSIS — T185XXA Foreign body in anus and rectum, initial encounter: Secondary | ICD-10-CM

## 2020-03-27 DIAGNOSIS — D509 Iron deficiency anemia, unspecified: Secondary | ICD-10-CM

## 2020-03-27 DIAGNOSIS — R0602 Shortness of breath: Secondary | ICD-10-CM

## 2020-03-27 DIAGNOSIS — D490 Neoplasm of unspecified behavior of digestive system: Secondary | ICD-10-CM

## 2020-03-27 DIAGNOSIS — K635 Polyp of colon: Secondary | ICD-10-CM

## 2020-03-27 DIAGNOSIS — I609 Nontraumatic subarachnoid hemorrhage, unspecified: Secondary | ICD-10-CM

## 2020-03-27 HISTORY — PX: COLONOSCOPY WITH PROPOFOL: SHX5780

## 2020-03-27 HISTORY — PX: POLYPECTOMY: SHX5525

## 2020-03-27 HISTORY — PX: FOREIGN BODY REMOVAL: SHX962

## 2020-03-27 HISTORY — PX: BIOPSY: SHX5522

## 2020-03-27 HISTORY — PX: SUBMUCOSAL TATTOO INJECTION: SHX6856

## 2020-03-27 HISTORY — PX: ESOPHAGOGASTRODUODENOSCOPY (EGD) WITH PROPOFOL: SHX5813

## 2020-03-27 LAB — CBC
HCT: 25.9 % — ABNORMAL LOW (ref 36.0–46.0)
Hemoglobin: 8.3 g/dL — ABNORMAL LOW (ref 12.0–15.0)
MCH: 28.6 pg (ref 26.0–34.0)
MCHC: 32 g/dL (ref 30.0–36.0)
MCV: 89.3 fL (ref 80.0–100.0)
Platelets: 168 10*3/uL (ref 150–400)
RBC: 2.9 MIL/uL — ABNORMAL LOW (ref 3.87–5.11)
RDW: 13.8 % (ref 11.5–15.5)
WBC: 4.9 10*3/uL (ref 4.0–10.5)
nRBC: 0 % (ref 0.0–0.2)

## 2020-03-27 LAB — BASIC METABOLIC PANEL
Anion gap: 6 (ref 5–15)
BUN: 6 mg/dL — ABNORMAL LOW (ref 8–23)
CO2: 22 mmol/L (ref 22–32)
Calcium: 8 mg/dL — ABNORMAL LOW (ref 8.9–10.3)
Chloride: 109 mmol/L (ref 98–111)
Creatinine, Ser: 1.11 mg/dL — ABNORMAL HIGH (ref 0.44–1.00)
GFR calc Af Amer: 52 mL/min — ABNORMAL LOW (ref >60)
GFR calc non Af Amer: 45 mL/min — ABNORMAL LOW (ref >60)
Glucose, Bld: 99 mg/dL (ref 70–99)
Potassium: 4.4 mmol/L (ref 3.5–5.1)
Sodium: 137 mmol/L (ref 135–145)

## 2020-03-27 LAB — GLUCOSE, CAPILLARY
Glucose-Capillary: 80 mg/dL (ref 70–99)
Glucose-Capillary: 87 mg/dL (ref 70–99)
Glucose-Capillary: 94 mg/dL (ref 70–99)

## 2020-03-27 LAB — MAGNESIUM: Magnesium: 2.1 mg/dL (ref 1.7–2.4)

## 2020-03-27 SURGERY — ESOPHAGOGASTRODUODENOSCOPY (EGD) WITH PROPOFOL
Anesthesia: Monitor Anesthesia Care

## 2020-03-27 MED ORDER — HEPARIN (PORCINE) 25000 UT/250ML-% IV SOLN
750.0000 [IU]/h | INTRAVENOUS | Status: AC
  Administered 2020-03-27 – 2020-03-29 (×2): 700 [IU]/h via INTRAVENOUS
  Filled 2020-03-27 (×2): qty 250

## 2020-03-27 MED ORDER — PROPOFOL 500 MG/50ML IV EMUL
INTRAVENOUS | Status: DC | PRN
  Administered 2020-03-27: 100 ug/kg/min via INTRAVENOUS

## 2020-03-27 MED ORDER — METOPROLOL TARTRATE 5 MG/5ML IV SOLN
INTRAVENOUS | Status: AC
  Filled 2020-03-27: qty 5

## 2020-03-27 MED ORDER — SODIUM CHLORIDE 0.9 % IV SOLN
3.0000 g | Freq: Four times a day (QID) | INTRAVENOUS | Status: DC
  Administered 2020-03-27 – 2020-03-30 (×10): 3 g via INTRAVENOUS
  Filled 2020-03-27 (×2): qty 3
  Filled 2020-03-27: qty 8
  Filled 2020-03-27: qty 3
  Filled 2020-03-27: qty 8
  Filled 2020-03-27 (×5): qty 3
  Filled 2020-03-27: qty 8
  Filled 2020-03-27 (×2): qty 3

## 2020-03-27 MED ORDER — FENTANYL CITRATE (PF) 100 MCG/2ML IJ SOLN
INTRAMUSCULAR | Status: AC
  Filled 2020-03-27: qty 2

## 2020-03-27 MED ORDER — FLUTICASONE-UMECLIDIN-VILANT 100-62.5-25 MCG/INH IN AEPB: 1.0000 | INHALATION_SPRAY | Freq: Every day | RESPIRATORY_TRACT | Status: DC

## 2020-03-27 MED ORDER — PHENYLEPHRINE 40 MCG/ML (10ML) SYRINGE FOR IV PUSH (FOR BLOOD PRESSURE SUPPORT)
PREFILLED_SYRINGE | INTRAVENOUS | Status: DC | PRN
  Administered 2020-03-27: 40 ug via INTRAVENOUS

## 2020-03-27 MED ORDER — IOHEXOL 300 MG/ML  SOLN
80.0000 mL | Freq: Once | INTRAMUSCULAR | Status: AC | PRN
  Administered 2020-03-27: 80 mL via INTRAVENOUS

## 2020-03-27 MED ORDER — PROPOFOL 10 MG/ML IV BOLUS
INTRAVENOUS | Status: DC | PRN
  Administered 2020-03-27 (×4): 20 mg via INTRAVENOUS

## 2020-03-27 MED ORDER — SPOT INK MARKER SYRINGE KIT
PACK | SUBMUCOSAL | Status: AC
  Filled 2020-03-27: qty 5

## 2020-03-27 MED ORDER — SPOT INK MARKER SYRINGE KIT
PACK | SUBMUCOSAL | Status: DC | PRN
  Administered 2020-03-27: 2 mL via SUBMUCOSAL

## 2020-03-27 MED ORDER — IOHEXOL 9 MG/ML PO SOLN
500.0000 mL | ORAL | Status: AC
  Administered 2020-03-27 (×2): 500 mL via ORAL

## 2020-03-27 MED ORDER — METOPROLOL TARTRATE 5 MG/5ML IV SOLN
5.0000 mg | Freq: Once | INTRAVENOUS | Status: AC
  Administered 2020-03-27: 5 mg via INTRAVENOUS

## 2020-03-27 MED ORDER — SPIRONOLACTONE 25 MG PO TABS
25.0000 mg | ORAL_TABLET | Freq: Every day | ORAL | Status: DC
  Administered 2020-03-27 – 2020-03-30 (×4): 25 mg via ORAL
  Filled 2020-03-27 (×4): qty 1

## 2020-03-27 MED ORDER — LACTATED RINGERS IV SOLN: INTRAVENOUS | Status: DC | PRN

## 2020-03-27 MED ORDER — FENTANYL CITRATE (PF) 100 MCG/2ML IJ SOLN
INTRAMUSCULAR | Status: DC | PRN
  Administered 2020-03-27: 25 ug via INTRAVENOUS

## 2020-03-27 SURGICAL SUPPLY — 25 items

## 2020-03-27 NOTE — Op Note (Signed)
[facility] Patient Name: Jennifer Osborne Procedure Date : 03/27/2020 MRN: [id number] Attending MD: Justice Britain , MD Date of Birth: [birth year] CSN: [id number] Age: 86 Admit Type: Inpatient Procedure:                Upper GI endoscopy Indications:              Anemia, Dark stools - query melena, Anticoagulation                            for AFib s/p recent Cardioversion Providers:                Justice Britain, MD, Grace Isaac, RN, William Dalton, Technician Referring MD:             [facility] Medicines:                Monitored Anesthesia Care Complications:            No immediate complications. Estimated Blood Loss:     Estimated blood loss: none. Procedure:                Pre-Anesthesia Assessment:                           - Prior to the procedure, a History and Physical                            was performed, and patient medications and                            allergies were reviewed. The patient's tolerance of                            previous anesthesia was also reviewed. The risks                            and benefits of the procedure and the sedation                            options and risks were discussed with the patient.                            All questions were answered, and informed consent                            was obtained. Prior Anticoagulants: The patient has                            taken Eliquis (apixaban), last dose was 2 days                            prior to procedure. ASA Grade Assessment: III - A  patient with severe systemic disease. After                            reviewing the risks and benefits, the patient was                            deemed in satisfactory condition to undergo the                            procedure.                           After obtaining informed consent, the endoscope was                            passed under direct  vision. Throughout the                            procedure, the patient's blood pressure, pulse, and                            oxygen saturations were monitored continuously. The                            [id number] Olympus gastroscope was                            introduced through the mouth, and advanced to the                            second part of duodenum. The upper GI endoscopy was                            accomplished without difficulty. The patient                            tolerated the procedure. Scope In: Scope Out: Findings:      No gross lesions were noted in the entire esophagus.      A very large intrathoracic hiatal hernia was present.      No gross mucosal lesions were noted in the entire examined stomach.       However, overall ability to visualize the entire stomach was decreased       as a result of the hernia sac.      No gross lesions were noted in the duodenal bulb, in the first portion       of the duodenum and in the second portion of the duodenum. Impression:               - No gross lesions in esophagus.                           - Large hiatal hernia.                           - No gross lesions in the stomach.                           -  No gross lesions in the duodenal bulb, in the                            first portion of the duodenum and in the second                            portion of the duodenum. Recommendation:           - Proceed to scheduled colonoscopy.                           - Recommending a CXR at minimum vs Barium Swallow                            to evaluate the overall size of the intrathoracic                            portion of her hiatal hernia.                           - OK to continue home dose PO PPI.                           - The findings and recommendations were discussed                            with the patient.                           - The findings and recommendations were discussed                             with the patient's family. Procedure Code(s):        --- Professional ---                           [phone], Esophagogastroduodenoscopy, flexible,                            transoral; diagnostic, including collection of                            specimen(s) by brushing or washing, when performed                            (separate procedure) Diagnosis Code(s):        --- Professional ---                           K44.9, Diaphragmatic hernia without obstruction or                            gangrene                           D50.9, Iron deficiency anemia, unspecified CPT copyright 2019 American Medical  Association. All rights reserved. The codes documented in this report are preliminary and upon coder review may  be revised to meet current compliance requirements. Justice Britain, MD 03/27/2020 9:31:26 AM Number of Addenda: 0

## 2020-03-27 NOTE — Progress Notes (Signed)
PROGRESS NOTE    Jennifer Osborne  [id number] DOB: 1931-04-30 DOA: 03/26/2020 PCP: [name], NP    No chief complaint on file.   Brief Narrative:  HPI per Dr. [name] is a 86 y.o. female with history of A. fib who underwent recent cardioversion for weakness CAD COPD chronic kidney disease stage III anemia was admitted at [facility] on Mar 23, 2020 for hypocalcemia and hypokalemia and generalized weakness.  Patient had potassium and calcium replaced.  Eventually patient's hemoglobin dropped from 10-7.8 and stool for occult blood was positive.  Patient was transferred to [facility] for GI bleed in the setting of the use of apixaban.  Patient states she has been noticing some black stool for the last 1 week.  Denies taking any NSAIDs.  Denies chest pain or shortness of breath.  Patient is afebrile.  Labs done on May 26 show hemoglobin of 7.8 creatinine 1.1 bicarb 24 potassium 3.7 platelets 203 WBC 4.9.  ED Course: Patient is a direct admit.  Assessment & Plan:   Principal Problem:   Acute GI bleeding Active Problems:   Chronic coronary artery disease   Late onset Alzheimer's disease without behavioral disturbance (HCC)   Paroxysmal atrial fibrillation (HCC)   Subarachnoid hemorrhage (HCC)   Essential hypertension   Acute blood loss anemia  1 acute blood loss anemia/acute GI bleed Patient presented with melanotic stools with a decreasing hemoglobin in the setting of anticoagulation for Eliquis for atrial fibrillation.  Hemoglobin on presentation was 7.8 from 10.8 on 03/08/2020.  Hemoglobin currently stable at 8.3 this morning.  Patient n.p.o. and underwent upper endoscopy which showed a large hiatal hernia with no signs of bleeding noted.  Patient subsequently underwent a colonoscopy with polyploid lesion found in the descending colon, circumferential with narrowing of the lumen with frond-like/villous infiltrative and polyploid oozing noted status post  biopsies.  4 mm polyp also noted in the sigmoid colon which was sessile status post resection and retrieval, foreign body also noted in the mid rectum removal of a staple with regular forceps.  Nonbleeding nonthrombosed external/internal hemorrhoids also noted.  Follow H&H.  Anticoagulation with heparin okay to be resumed 12 hours post procedure per GI.  Transfusion threshold hemoglobin <7.  PPI.  CT abdomen and pelvis ordered per GI.  GI following and appreciate input and recommendations.  2.  Paroxysmal A. fib Was called by RN this afternoon that patient with heart rates persistently in the 150s.  Patient noted not to have received any of her medications today.  Lopressor 5 mg IV x1.  Patient to be given her medications of digoxin and Cardizem which was not given early on this morning for rate control.  Will resume anticoagulation with heparin 12 hours post procedure with no bolus per GI recommendations as patient with recent cardioversion.  Outpatient follow-up with GI.  3.  Shortness of breath/?  Pneumonia Patient with some complaints of shortness of breath per RN while noted with heart rates in the 150s.  Chest x-ray ordered with concerns for new patchy airspace opacities in left upper lobe, probable bibasilar atelectasis and left pleural effusion.  Check urine Legionella antigen.  Check urine pneumococcus antigen.  SLP.  Place on IV Unasyn.  Supportive care.  4.  Abnormal colonoscopy Polypoid lesion noted on descending colon, circumferential with narrowing of the lumen, frond-like/villous infiltrative and polypoid losing noted on colonoscopy status post biopsies.  CT abdomen and pelvis ordered per GI.  GI following and  appreciate input and recommendations.  5.  Prior history of subarachnoid hemorrhage Stable.  6.  Coronary artery disease Stable.  Continue home regimen of digoxin and Cardizem.  Resume home regimen Aldactone.  7.  Hypothyroidism Synthroid.  8.  Chronic kidney disease stage  III Stable.  9.  Dementia Stable.   DVT prophylaxis: SCDs/resume heparin 12 hours post procedures this evening at 9 PM. Code Status: Full Family Communication: Updated patient.  No family at bedside. Disposition:   Status is: Inpatient    Dispo: The patient is from: Home              Anticipated d/c is to: Home              Anticipated d/c date is: To be determined.              Patient currently being evaluated for melanotic stool/anemia.        Consultants:   Gastroenterology: Dr. Rush Landmark 03/26/2020  Procedures:   CT abdomen and pelvis 03/27/2020.  Chest x-ray 03/27/2020  Upper endoscopy 03/27/2020  Colonoscopy 03/27/2020  Antimicrobials:   IV Unasyn 03/27/2020   Subjective: Patient just returned from endoscopy.  Denies any stools since admission.  No chest pain.  No shortness of breath.  Feels better.  Objective: Vitals:   03/27/20 0931 03/27/20 0940 03/27/20 0950 03/27/20 1022  BP: (!) 141/84 (!) 114/96 (!) 165/121 130/61  Pulse: 95 76 85 98  Resp: (!) 26 (!) 30 (!) 24 20  Temp:    97.6 F (36.4 C)  TempSrc:    Oral  SpO2: 98% 93% 97% 98%  Weight:      Height:        Intake/Output Summary (Last 24 hours) at 03/27/2020 1106 Last data filed at 03/27/2020 1024 Gross per 24 hour  Intake 1565.21 ml  Output --  Net 1565.21 ml   Filed Weights   03/26/20 0328 03/26/20 0413  Weight: 58 kg 58 kg    Examination:  General exam: Appears calm and comfortable  Respiratory system: Lungs clear to auscultation bilaterally.  No wheezes, no crackles, no rhonchi.  Normal respiratory effort.  Cardiovascular system: Regular rate rhythm no murmurs rubs or gallops.  No JVD.  No lower extremity edema. Gastrointestinal system: Abdomen is soft, nontender, nondistended, positive bowel sounds.  No rebound.  No guarding. Central nervous system: Alert and oriented. No focal neurological deficits. Extremities: Symmetric 5 x 5 power. Skin: No rashes, lesions or  ulcers Psychiatry: Judgement and insight appear normal. Mood & affect appropriate.     Data Reviewed: I have personally reviewed following labs and imaging studies  CBC: Recent Labs  Lab 03/26/20 0533 03/26/20 0813 03/26/20 1629 03/27/20 0405  WBC 4.7 4.6  --  4.9  HGB 7.8* 7.6* 8.8* 8.3*  HCT 24.5* 24.2* 27.7* 25.9*  MCV 88.1 90.0  --  89.3  PLT 173 150  --  [id number]    Basic Metabolic Panel: Recent Labs  Lab 03/26/20 0533 03/27/20 0405  NA 137 137  K 3.8 4.4  CL 107 109  CO2 24 22  GLUCOSE 90 99  BUN 8 6*  CREATININE 1.11* 1.11*  CALCIUM 8.1* 8.0*  MG 1.6* 2.1    GFR: Estimated Creatinine Clearance: 31.4 mL/min (A) (by C-G formula based on SCr of 1.11 mg/dL (H)).  Liver Function Tests: Recent Labs  Lab 03/26/20 0533  AST 20  ALT 15  ALKPHOS 63  BILITOT 0.4  PROT 4.6*  ALBUMIN 2.2*  CBG: Recent Labs  Lab 03/27/20 0054  GLUCAP 94     Recent Results (from the past 240 hour(s))  SARS Coronavirus 2 by RT PCR (hospital order, performed in [facility] hospital lab) Nasopharyngeal Nasopharyngeal Swab     Status: None   Collection Time: 03/26/20  6:23 AM   Specimen: Nasopharyngeal Swab  Result Value Ref Range Status   SARS Coronavirus 2 NEGATIVE NEGATIVE Final    Comment: (NOTE) SARS-CoV-2 target nucleic acids are NOT DETECTED. The SARS-CoV-2 RNA is generally detectable in upper and lower respiratory specimens during the acute phase of infection. The lowest concentration of SARS-CoV-2 viral copies this assay can detect is 250 copies / mL. A negative result does not preclude SARS-CoV-2 infection and should not be used as the sole basis for treatment or other patient management decisions.  A negative result may occur with improper specimen collection / handling, submission of specimen other than nasopharyngeal swab, presence of viral mutation(s) within the areas targeted by this assay, and inadequate number of viral copies (<250 copies / mL). A  negative result must be combined with clinical observations, patient history, and epidemiological information. Fact Sheet for Patients:   StrictlyIdeas.no Fact Sheet for Healthcare Providers: BankingDealers.co.za This test is not yet approved or cleared  by the [place] FDA and has been authorized for detection and/or diagnosis of SARS-CoV-2 by FDA under an Emergency Use Authorization (EUA).  This EUA will remain in effect (meaning this test can be used) for the duration of the COVID-19 declaration under Section 564(b)(1) of the Act, 21 U.S.C. section 360bbb-3(b)(1), unless the authorization is terminated or revoked sooner. Performed at [facility], [place] [address] [place], [place] [address]          Radiology Studies: No results found.      Scheduled Meds: . digoxin  0.125 mg Oral Daily  . diltiazem  120 mg Oral Daily  . DULoxetine  30 mg Oral Daily  . fluticasone furoate-vilanterol  1 puff Inhalation Daily   And  . umeclidinium bromide  1 puff Inhalation Daily  . levothyroxine  37.5 mcg Oral QAC breakfast  . mirtazapine  7.5 mg Oral QHS  . pantoprazole  40 mg Oral BID  . rosuvastatin  20 mg Oral Daily   Continuous Infusions: . dextrose 5 % and 0.9% NaCl 75 mL/hr at 03/27/20 1028     LOS: 1 day    Time spent: 35 minutes    Irine Seal, MD [facility]   To contact the attending provider between 7A-7P or the covering provider during after hours 7P-7A, please log into the web site [email or web address] and access using universal [place] password for that web site. If you do not have the password, please call the hospital operator.  03/27/2020, 11:06 AM

## 2020-03-27 NOTE — Interval H&P Note (Signed)
History and Physical Interval Note:  03/27/2020 7:32 AM  Jennifer Osborne  has presented today for surgery, with the diagnosis of FOBT positive.  Dark but not melenic stools.  Anemia..  The various methods of treatment have been discussed with the patient and family. After consideration of risks, benefits and other options for treatment, the patient has consented to  Procedure(s): ESOPHAGOGASTRODUODENOSCOPY (EGD) WITH PROPOFOL (N/A) COLONOSCOPY WITH PROPOFOL (N/A) as a surgical intervention.  The patient's history has been reviewed, patient examined, no change in status, stable for surgery.  I have reviewed the patient's chart and labs.  Questions were answered to the patient's satisfaction.     [employer]

## 2020-03-27 NOTE — Transfer of Care (Signed)
Immediate Anesthesia Transfer of Care Note  Patient: BRIGGITTE BOLINE  Procedure(s) Performed: ESOPHAGOGASTRODUODENOSCOPY (EGD) WITH PROPOFOL (N/A ) COLONOSCOPY WITH PROPOFOL (N/A ) BIOPSY POLYPECTOMY SUBMUCOSAL TATTOO INJECTION FOREIGN BODY REMOVAL  Patient Location: Endoscopy Unit  Anesthesia Type:MAC  Level of Consciousness: drowsy  Airway & Oxygen Therapy: Patient Spontanous Breathing and Patient connected to face mask oxygen  Post-op Assessment: Report given to RN and Post -op Vital signs reviewed and stable  Post vital signs: Reviewed and stable  Last Vitals:  Vitals Value Taken Time  BP 141/84 03/27/20 0932  Temp    Pulse 95 03/27/20 0931  Resp 21 03/27/20 0934  SpO2    Vitals shown include unvalidated device data.  Last Pain:  Vitals:   03/27/20 0931  TempSrc:   PainSc: 0-No pain         Complications: No apparent anesthesia complications

## 2020-03-27 NOTE — Progress Notes (Signed)
ANTICOAGULATION CONSULT NOTE - Initial Consult  Pharmacy Consult for heparin  Indication: atrial fibrillation  Allergies  Allergen Reactions  . Morphine And Related Itching    Pt states she had itching, no rash  . Meperidine Nausea Only  . Pregabalin Other (See Comments)    Caused stroke    Patient Measurements: Height: 5\' 4"  (162.6 cm) Weight: 58 kg (127 lb 13.9 oz) IBW/kg (Calculated) : 54.7 Heparin Dosing Weight: 58kg  Vital Signs: Temp: 98.3 F (36.8 C) (05/29 1545) Temp Source: Oral (05/29 1545) BP: 172/80 (05/29 1545) Pulse Rate: 116 (05/29 1545)  Labs: Recent Labs    03/26/20 0533 03/26/20 0533 03/26/20 0813 03/26/20 0813 03/26/20 1629 03/27/20 0405  HGB 7.8*   < > 7.6*   < > 8.8* 8.3*  HCT 24.5*   < > 24.2*  --  27.7* 25.9*  PLT 173  --  150  --   --  168  LABPROT 15.1  --   --   --   --   --   INR 1.2  --   --   --   --   --   CREATININE 1.11*  --   --   --   --  1.11*   < > = values in this interval not displayed.    Estimated Creatinine Clearance: 31.4 mL/min (A) (by C-G formula based on SCr of 1.11 mg/dL (H)).   Medical History: Past Medical History:  Diagnosis Date  . Abnormal weight loss   . Ambulatory dysfunction   . Asthma   . Atrial fibrillation ([place])   . Basilar artery stenosis   . Bile reflux gastritis   . BMI 21.0-21.9, adult   . Brain bleed ([place])    history in 2019  . Cerumen impaction   . Chronic constipation   . Chronic nausea   . COPD (chronic obstructive pulmonary disease) ([place])   . Degenerative disc disease, lumbar   . Depression with anxiety   . Diastolic CHF ([place])   . Dysphagia   . External hemorrhoids   . Frequent falls   . Generalized anxiety disorder   . GERD (gastroesophageal reflux disease)   . Hiatal hernia   . Hyperlipemia   . Hyperlipemia   . Hypothyroidism   . Insomnia   . Internal hemorrhoids   . Iron deficiency anemia   . Major depression   . Migraine   . Neurological disorder    Huntington's  Similar  . Osteoarthritis, generalized   . PAD (peripheral artery disease) ([place])   . Paroxysmal atrial fibrillation (HCC)   . PVD (peripheral vascular disease) ([place])   . Radiculopathy    left lower leg  . Sigmoid diverticulosis   . SOB (shortness of breath)   . [place] dance   . Systemic lupus erythematosus (SLE) in remission ([place])   . Urinary incontinence   . Urinary tract infection   . Vascular dementia without behavioral disturbance ([place])   . Vitamin B 12 deficiency   . Vitamin D deficiency   . Weakness     Assessment: 86 year old female with history of afib on apixaban, last dose appears to be on 5/25.   S/p endoscopy which was unremarkable followed by colonoscopy with polyp removal, polypoid lesion that was biopsied to rule out malignancy and foreign body removal, staple? Per GI recs hold heparin at least 12 hours, orders from primary team to start heparin at 9pm tonight without bolus. Hgb fairly stable in 8s.  plt count ok.   Goal of Therapy:  Heparin level 0.3-0.5 units/ml Monitor platelets by anticoagulation protocol: Yes   Plan:  Start heparin infusion at 700 units/hr Check anti-Xa level in 8 hours and daily while on heparin Continue to monitor H&H and platelets  Erin Hearing PharmD., BCPS Clinical Pharmacist 03/27/2020 4:26 PM

## 2020-03-27 NOTE — Op Note (Signed)
[facility] Patient Name: Jennifer Osborne Procedure Date : 03/27/2020 MRN: [id number] Attending MD: Justice Britain , MD Date of Birth: 06-Feb-1931 CSN: [id number] Age: 86 Admit Type: Inpatient Procedure:                Colonoscopy Indications:              Intermittent rectal bleeding, anemi, dark stools,                            On Anticoagulation Providers:                Justice Britain, MD, Grace Isaac, RN, William Dalton, Technician Referring MD:             [facility] Medicines:                Monitored Anesthesia Care Complications:            No immediate complications. Estimated Blood Loss:     Estimated blood loss was minimal. Procedure:                Pre-Anesthesia Assessment:                           - Prior to the procedure, a History and Physical                            was performed, and patient medications and                            allergies were reviewed. The patient's tolerance of                            previous anesthesia was also reviewed. The risks                            and benefits of the procedure and the sedation                            options and risks were discussed with the patient.                            All questions were answered, and informed consent                            was obtained. Prior Anticoagulants: The patient has                            taken Eliquis (apixaban), last dose was 2 days                            prior to procedure. ASA Grade Assessment: III - A  patient with severe systemic disease. After                            reviewing the risks and benefits, the patient was                            deemed in satisfactory condition to undergo the                            procedure.                           After obtaining informed consent, the colonoscope                            was passed under direct vision.  Throughout the                            procedure, the patient's blood pressure, pulse, and                            oxygen saturations were monitored continuously. The                            [id number]-[id number] ([id number]) Olympus pediatric colonoscope                            was introduced through the anus and advanced to the                            the cecum, identified by appendiceal orifice and                            ileocecal valve. Scope In: 8:49:23 AM Scope Out: 9:22:03 AM Scope Withdrawal Time: 0 hours 27 minutes 41 seconds  Total Procedure Duration: 0 hours 32 minutes 40 seconds  Findings:      The digital rectal exam findings include skin tags and large       hemorrhoids. Pertinent negatives include no palpable rectal lesions.      The colon (entire examined portion) was moderately tortuous.      A polypoid lesion was found in the descending colon and it is       circumferential and caused narrowing of the lumen. The lesion was       frond-like/villous, infiltrative and polypoid. Oozing was present. This       was noted from 34 cm to 40 cm. Biopsies were taken with a cold forceps       for histology to rule out underlying malignancy. Area 3-4 cm distal to       the region was tattooed with an injection of Spot (carbon black).      A 4 mm polyp was found in the sigmoid colon. The polyp was sessile. The       polyp was removed with a cold snare. Resection and retrieval were       complete.      A foreign body was found in the mid rectum.  Removal of a staple was       accomplished with a regular forceps.      What appeared to be a possible colo-colo anastomosis (end-to-end) was       present just proximal to the staple.      Non-bleeding non-thrombosed external and internal hemorrhoids were found       during retroflexion, during perianal exam and during digital exam. The       hemorrhoids were Grade II (internal hemorrhoids that prolapse but reduce        spontaneously). Impression:               - Hemorrhoids and sskin tags found on digital                            rectal exam.                           - Tortuous colon.                           - Rule out malignancy, polypoid lesion in the                            descending colon. Biopsied. Tattooed distally to                            the lesion.                           - One 4 mm polyp in the sigmoid colon, removed with                            a cold snare. Resected and retrieved.                           - Foreign body - staple in the mid rectum. Removal                            was successful.                           - What appeared to be a possible colo-colo                            anastomosis (end-to-end) was present just proximal                            to the staple.                           - Non-bleeding non-thrombosed external and internal                            hemorrhoids. Recommendation:           - The patient will be observed post-procedure,  until all discharge criteria are met.                           - Return patient to hospital ward for ongoing care.                           - Advance diet as tolerated.                           - Await pathology results.                           - Patient has not had previous colonic surgery per                            family report though she has had a "bladder                            tacking" and hysterectomy reported.                           - Recommend CEA level to be sent with labs tomorrow.                           - Recommend a CT-AP to be performed in next 24                            hours, if Cr is felt to be stable.                           - Recommend trying to hold anticoagulation for at                            lest 24-48 hours. However, if felt to need it                            sooner since recent Cardioversion, then would                             recommend heparin drip without bolus in 12-hours.                           - Monitor for signs/symptoms of bleeding,                            perforation, and infection. If issues please call                            our number to get further assistance as needed.                           - The findings and recommendations were discussed  with the patient.                           - The findings and recommendations were discussed                            with the patient's family.                           - The findings and recommendations were discussed                            with the referring physician. Procedure Code(s):        --- Professional ---                           [phone], Colonoscopy, flexible; with removal of                            tumor(s), polyp(s), or other lesion(s) by snare                            technique                           45379, Colonoscopy, flexible; with removal of                            foreign body(s)                           45381, Colonoscopy, flexible; with directed                            submucosal injection(s), any substance                           32992, 59, Colonoscopy, flexible; with biopsy,                            single or multiple Diagnosis Code(s):        --- Professional ---                           K64.1, Second degree hemorrhoids                           D49.0, Neoplasm of unspecified behavior of                            digestive system                           K63.5, Polyp of colon                           T18.5XXA, Foreign body in anus and rectum, initial  encounter                           K62.5, Hemorrhage of anus and rectum                           D50.9, Iron deficiency anemia, unspecified                           Q43.8, Other specified congenital malformations of                            intestine CPT copyright 2019 American  Medical Association. All rights reserved. The codes documented in this report are preliminary and upon coder review may  be revised to meet current compliance requirements. Justice Britain, MD 03/27/2020 9:50:06 AM Number of Addenda: 0

## 2020-03-27 NOTE — Anesthesia Postprocedure Evaluation (Signed)
Anesthesia Post Note  Patient: Jennifer Osborne  Procedure(s) Performed: ESOPHAGOGASTRODUODENOSCOPY (EGD) WITH PROPOFOL (N/A ) COLONOSCOPY WITH PROPOFOL (N/A ) BIOPSY POLYPECTOMY SUBMUCOSAL TATTOO INJECTION FOREIGN BODY REMOVAL     Patient location during evaluation: PACU Anesthesia Type: MAC Level of consciousness: awake and alert Pain management: pain level controlled Vital Signs Assessment: post-procedure vital signs reviewed and stable Respiratory status: spontaneous breathing, nonlabored ventilation and respiratory function stable Cardiovascular status: stable and blood pressure returned to baseline Anesthetic complications: no    Last Vitals:  Vitals:   03/27/20 0950 03/27/20 1022  BP: (!) 165/121 130/61  Pulse: 85 98  Resp: (!) 24 20  Temp:  36.4 C  SpO2: 97% 98%    Last Pain:  Vitals:   03/27/20 1022  TempSrc: Oral  PainSc:                  [name]

## 2020-03-28 DIAGNOSIS — R933 Abnormal findings on diagnostic imaging of other parts of digestive tract: Secondary | ICD-10-CM

## 2020-03-28 DIAGNOSIS — K449 Diaphragmatic hernia without obstruction or gangrene: Secondary | ICD-10-CM

## 2020-03-28 DIAGNOSIS — K6389 Other specified diseases of intestine: Secondary | ICD-10-CM

## 2020-03-28 DIAGNOSIS — Z7901 Long term (current) use of anticoagulants: Secondary | ICD-10-CM

## 2020-03-28 LAB — BASIC METABOLIC PANEL
Anion gap: 10 (ref 5–15)
BUN: <5 mg/dL — ABNORMAL LOW (ref 8–23)
CO2: 21 mmol/L — ABNORMAL LOW (ref 22–32)
Calcium: 7.9 mg/dL — ABNORMAL LOW (ref 8.9–10.3)
Chloride: 106 mmol/L (ref 98–111)
Creatinine, Ser: 1.18 mg/dL — ABNORMAL HIGH (ref 0.44–1.00)
GFR calc Af Amer: 48 mL/min — ABNORMAL LOW (ref >60)
GFR calc non Af Amer: 42 mL/min — ABNORMAL LOW (ref >60)
Glucose, Bld: 91 mg/dL (ref 70–99)
Potassium: 3.5 mmol/L (ref 3.5–5.1)
Sodium: 137 mmol/L (ref 135–145)

## 2020-03-28 LAB — HEPARIN LEVEL (UNFRACTIONATED)
Heparin Unfractionated: 0.37 IU/mL (ref 0.30–0.70)
Heparin Unfractionated: 0.42 IU/mL (ref 0.30–0.70)

## 2020-03-28 LAB — GLUCOSE, CAPILLARY
Glucose-Capillary: 80 mg/dL (ref 70–99)
Glucose-Capillary: 86 mg/dL (ref 70–99)
Glucose-Capillary: 90 mg/dL (ref 70–99)

## 2020-03-28 LAB — CBC WITH DIFFERENTIAL/PLATELET
Abs Immature Granulocytes: 0.01 10*3/uL (ref 0.00–0.07)
Basophils Absolute: 0 10*3/uL (ref 0.0–0.1)
Basophils Relative: 0 %
Eosinophils Absolute: 0.1 10*3/uL (ref 0.0–0.5)
Eosinophils Relative: 2 %
HCT: 26.1 % — ABNORMAL LOW (ref 36.0–46.0)
Hemoglobin: 8.4 g/dL — ABNORMAL LOW (ref 12.0–15.0)
Immature Granulocytes: 0 %
Lymphocytes Relative: 39 %
Lymphs Abs: 2 10*3/uL (ref 0.7–4.0)
MCH: 27.9 pg (ref 26.0–34.0)
MCHC: 32.2 g/dL (ref 30.0–36.0)
MCV: 86.7 fL (ref 80.0–100.0)
Monocytes Absolute: 0.4 10*3/uL (ref 0.1–1.0)
Monocytes Relative: 8 %
Neutro Abs: 2.5 10*3/uL (ref 1.7–7.7)
Neutrophils Relative %: 51 %
Platelets: 156 10*3/uL (ref 150–400)
RBC: 3.01 MIL/uL — ABNORMAL LOW (ref 3.87–5.11)
RDW: 13.7 % (ref 11.5–15.5)
WBC: 5 10*3/uL (ref 4.0–10.5)
nRBC: 0 % (ref 0.0–0.2)

## 2020-03-28 LAB — APTT
aPTT: 135 seconds — ABNORMAL HIGH (ref 24–36)
aPTT: 139 seconds — ABNORMAL HIGH (ref 24–36)

## 2020-03-28 LAB — MAGNESIUM: Magnesium: 1.8 mg/dL (ref 1.7–2.4)

## 2020-03-28 MED ORDER — POTASSIUM CHLORIDE CRYS ER 20 MEQ PO TBCR
40.0000 meq | EXTENDED_RELEASE_TABLET | Freq: Once | ORAL | Status: AC
  Administered 2020-03-28: 40 meq via ORAL
  Filled 2020-03-28: qty 2

## 2020-03-28 MED ORDER — MAGNESIUM SULFATE 2 GM/50ML IV SOLN
2.0000 g | Freq: Once | INTRAVENOUS | Status: AC
  Administered 2020-03-28: 2 g via INTRAVENOUS
  Filled 2020-03-28: qty 50

## 2020-03-28 NOTE — Progress Notes (Signed)
ANTICOAGULATION CONSULT NOTE - Initial Consult  Pharmacy Consult for heparin  Indication: atrial fibrillation  Allergies  Allergen Reactions  . Morphine And Related Itching    Pt states she had itching, no rash  . Meperidine Nausea Only  . Pregabalin Other (See Comments)    Caused stroke    Patient Measurements: Height: 5\' 4"  (162.6 cm) Weight: 57.1 kg (125 lb 14.1 oz) IBW/kg (Calculated) : 54.7 Heparin Dosing Weight: 58kg  Vital Signs: Temp: 98.2 F (36.8 C) (05/30 0418) Temp Source: Oral (05/30 0418) BP: 133/47 (05/30 0420) Pulse Rate: 70 (05/30 0420)  Labs: Recent Labs    03/26/20 0533 03/26/20 0533 03/26/20 0813 03/26/20 0813 03/26/20 1629 03/26/20 1629 03/27/20 0405 03/28/20 0827  HGB 7.8*   < > 7.6*   < > 8.8*   < > 8.3* 8.4*  HCT 24.5*   < > 24.2*   < > 27.7*  --  25.9* 26.1*  PLT 173   < > 150  --   --   --  168 156  APTT  --   --   --   --   --   --   --  135*  LABPROT 15.1  --   --   --   --   --   --   --   INR 1.2  --   --   --   --   --   --   --   HEPARINUNFRC  --   --   --   --   --   --   --  0.37  CREATININE 1.11*  --   --   --   --   --  1.11* 1.18*   < > = values in this interval not displayed.    Estimated Creatinine Clearance: 29.6 mL/min (A) (by C-G formula based on SCr of 1.18 mg/dL (H)).   Medical History: Past Medical History:  Diagnosis Date  . Abnormal weight loss   . Ambulatory dysfunction   . Asthma   . Atrial fibrillation ([place])   . Basilar artery stenosis   . Bile reflux gastritis   . BMI 21.0-21.9, adult   . Brain bleed ([place])    history in 2019  . Cerumen impaction   . Chronic constipation   . Chronic nausea   . COPD (chronic obstructive pulmonary disease) ([place])   . Degenerative disc disease, lumbar   . Depression with anxiety   . Diastolic CHF ([place])   . Dysphagia   . External hemorrhoids   . Frequent falls   . Generalized anxiety disorder   . GERD (gastroesophageal reflux disease)   . Hiatal hernia   .  Hyperlipemia   . Hyperlipemia   . Hypothyroidism   . Insomnia   . Internal hemorrhoids   . Iron deficiency anemia   . Major depression   . Migraine   . Neurological disorder    Huntington's Similar  . Osteoarthritis, generalized   . PAD (peripheral artery disease) ([place])   . Paroxysmal atrial fibrillation (HCC)   . PVD (peripheral vascular disease) ([place])   . Radiculopathy    left lower leg  . Sigmoid diverticulosis   . SOB (shortness of breath)   . [place] dance   . Systemic lupus erythematosus (SLE) in remission ([place])   . Urinary incontinence   . Urinary tract infection   . Vascular dementia without behavioral disturbance ([place])   . Vitamin B 12 deficiency   .  Vitamin D deficiency   . Weakness     Assessment: 86 year old female with history of afib on apixaban, last dose appears to be on 5/25.   S/p endoscopy which was unremarkable followed by colonoscopy with polyp removal, polypoid lesion that was biopsied to rule out malignancy and foreign body removal. Pharmacy consulted to start heparin infusion 12 hours post-procedure without bolus.  Heparin level drawn late (~11 hours from start of infusion) and therapeutic at 0.37. aPTT was obtained to r/o effect of apixaban, and was unexpectedly elevated at 135, not correlating with heparin level. Since heparin infusion started >72 hours from last apixaban dose, will go with heparin level and recheck aPTT with next heparin level. Hemoglobin stable in 8s, platelet wnl. No bleeding reported per RN.  Goal of Therapy:  Heparin level 0.3-0.5 units/ml Monitor platelets by anticoagulation protocol: Yes   Plan:  Continue heparin infusion at 700 units/hr Repeat heparin level and aPTT in 8 hours Monitor daily heparin level, CBC, and s/sx of bleeding  Berenice Bouton, PharmD PGY1 Pharmacy Resident  Please check AMION for all [place] phone numbers After 10:00 PM, call [place] [phone]  03/28/2020 9:42 AM

## 2020-03-28 NOTE — Progress Notes (Signed)
[place] for heparin  Indication: atrial fibrillation  Allergies  Allergen Reactions  . Morphine And Related Itching    Pt states she had itching, no rash  . Meperidine Nausea Only  . Pregabalin Other (See Comments)    Caused stroke    Patient Measurements: Height: 5\' 4"  (162.6 cm) Weight: 57.1 kg (125 lb 14.1 oz) IBW/kg (Calculated) : 54.7 Heparin Dosing Weight: 58kg  Vital Signs: Temp: 98.3 F (36.8 C) (05/30 1411) Temp Source: Oral (05/30 1411) BP: 127/68 (05/30 1411) Pulse Rate: 58 (05/30 1411)  Labs: Recent Labs    03/26/20 0533 03/26/20 0533 03/26/20 0813 03/26/20 0813 03/26/20 1629 03/26/20 1629 03/27/20 0405 03/28/20 0827  HGB 7.8*   < > 7.6*   < > 8.8*   < > 8.3* 8.4*  HCT 24.5*   < > 24.2*   < > 27.7*  --  25.9* 26.1*  PLT 173   < > 150  --   --   --  168 156  APTT  --   --   --   --   --   --   --  135*  LABPROT 15.1  --   --   --   --   --   --   --   INR 1.2  --   --   --   --   --   --   --   HEPARINUNFRC  --   --   --   --   --   --   --  0.37  CREATININE 1.11*  --   --   --   --   --  1.11* 1.18*   < > = values in this interval not displayed.    Estimated Creatinine Clearance: 29.6 mL/min (A) (by C-G formula based on SCr of 1.18 mg/dL (H)).   Medical History: Past Medical History:  Diagnosis Date  . Abnormal weight loss   . Ambulatory dysfunction   . Asthma   . Atrial fibrillation ([place])   . Basilar artery stenosis   . Bile reflux gastritis   . BMI 21.0-21.9, adult   . Brain bleed ([place])    history in 2019  . Cerumen impaction   . Chronic constipation   . Chronic nausea   . COPD (chronic obstructive pulmonary disease) ([place])   . Degenerative disc disease, lumbar   . Depression with anxiety   . Diastolic CHF ([place])   . Dysphagia   . External hemorrhoids   . Frequent falls   . Generalized anxiety disorder   . GERD (gastroesophageal reflux disease)   . Hiatal hernia   . Hyperlipemia   .  Hyperlipemia   . Hypothyroidism   . Insomnia   . Internal hemorrhoids   . Iron deficiency anemia   . Major depression   . Migraine   . Neurological disorder    H[place]'s Similar  . Osteoarthritis, generalized   . PAD (peripheral artery disease) ([place])   . Paroxysmal atrial fibrillation (HCC)   . PVD (peripheral vascular disease) ([place])   . Radiculopathy    left lower leg  . Sigmoid diverticulosis   . SOB (shortness of breath)   . [place] dance   . Systemic lupus erythematosus (SLE) in remission ([place])   . Urinary incontinence   . Urinary tract infection   . Vascular dementia without behavioral disturbance ([place])   . Vitamin B 12 deficiency   . Vitamin D  deficiency   . Weakness     Assessment: 86 year old female with history of afib on apixaban, last dose appears to be on 5/25.   S/p endoscopy which was unremarkable followed by colonoscopy with polyp removal, polypoid lesion that was biopsied to rule out malignancy and foreign body removal. Pharmacy consulted to start heparin infusion 12 hours post-procedure without bolus.  Heparin level at goal and consistent with previous lab at 0.42. aPTT again checked and still elevated at 139, not correlating with heparin level. Since heparin infusion started >72 hours from last apixaban dose, will go with heparin level. Hemoglobin stable in 8s, platelet wnl. No bleeding reported per RN.  Goal of Therapy:  Heparin level 0.3-0.5 units/ml Monitor platelets by anticoagulation protocol: Yes   Plan:  Continue heparin infusion at 700 units/hr Monitor daily heparin level, CBC, and s/sx of bleeding  Erin Hearing PharmD., BCPS Clinical Pharmacist 03/28/2020 5:47 PM   Please check AMION for all [place] phone numbers After 10:00 PM, call [place] [phone]  03/28/2020 5:46 PM

## 2020-03-28 NOTE — Progress Notes (Signed)
PROGRESS NOTE    Jennifer Osborne  [id number] DOB: 25-Apr-1931 DOA: 03/26/2020 PCP: Jennifer Morton, NP    No chief complaint on file.   Brief Narrative:  HPI per Dr. [name] is a 86 y.o. female with history of A. fib who underwent recent cardioversion for weakness CAD COPD chronic kidney disease stage III anemia was admitted at [facility] on Mar 23, 2020 for hypocalcemia and hypokalemia and generalized weakness.  Patient had potassium and calcium replaced.  Eventually patient's hemoglobin dropped from 10-7.8 and stool for occult blood was positive.  Patient was transferred to [facility] for GI bleed in the setting of the use of apixaban.  Patient states she has been noticing some black stool for the last 1 week.  Denies taking any NSAIDs.  Denies chest pain or shortness of breath.  Patient is afebrile.  Labs done on May 26 show hemoglobin of 7.8 creatinine 1.1 bicarb 24 potassium 3.7 platelets 203 WBC 4.9.  ED Course: Patient is a direct admit.  Assessment & Plan:   Principal Problem:   Acute GI bleeding Active Problems:   Chronic coronary artery disease   Late onset Alzheimer's disease without behavioral disturbance (HCC)   Paroxysmal atrial fibrillation (HCC)   Subarachnoid hemorrhage (HCC)   Essential hypertension   Acute blood loss anemia   SOB (shortness of breath)   Pneumonia of left upper lobe due to infectious organism  1 acute blood loss anemia/acute GI bleed Patient presented with melanotic stools with a decreasing hemoglobin in the setting of anticoagulation for Eliquis for atrial fibrillation.  Hemoglobin on presentation was 7.8 from 10.8 on 03/08/2020.  Hemoglobin currently stable at 8.4 this morning.  Patient s/p upper endoscopy which showed a large hiatal hernia with no signs of bleeding noted.  Patient subsequently underwent a colonoscopy with polyploid lesion found in the descending colon, circumferential with narrowing of the lumen with  frond-like/villous infiltrative and polyploid oozing noted status post biopsies.  4 mm polyp also noted in the sigmoid colon which was sessile status post resection and retrieval, foreign body also noted in the mid rectum removal of a staple with regular forceps.  Nonbleeding nonthrombosed external/internal hemorrhoids also noted.  CT abdomen and pelvis ordered per GI consistent with circumferential polypoid lesion in descending colon as noted on colonoscopy concerning for mass/malignancy.  Patient resumed back on anticoagulation of heparin with no overt bleeding and hemoglobin currently stable.  GI following and appreciate input and recommendations.  2.  Paroxysmal A. fib Was called by RN this afternoon that patient with heart rates persistently in the 150s on 03/27/2020.  Patient noted not to have received any of her medications today.  Lopressor 5 mg IV x1.  Patient subsequently was placed on home regimen of Cardizem and digoxin for rate control.  Better rate control today.  IV heparin for anticoagulation.  Outpatient follow-up with cardiology.    3.  Shortness of breath/?  Pneumonia Patient with some complaints of shortness of breath per RN while noted with heart rates in the 150s on 03/27/2020.  Chest x-ray ordered with concerns for new patchy airspace opacities in left upper lobe, probable bibasilar atelectasis and left pleural effusion.  Urine Legionella antigen pending.  Urine pneumococcus antigen pending.  SLP pending.  IV Unasyn.  Supportive care.   4.  Abnormal colonoscopy Polypoid lesion noted on descending colon, circumferential with narrowing of the lumen, frond-like/villous infiltrative and polypoid losing noted on colonoscopy status post biopsies.  CT  abdomen and pelvis ordered per GI which showed a circumferential wall thickening of the colon at the splenic flexure measuring at least 10 cm in length likely representing mass/malignancy identified on colonoscopy.  8 mm lymph node between the  colon and liver not present on 2017 abdominal CT suspicious for lymph node mets.  No other evidence of metastatic disease within the abdomen or pelvis.  Small bilateral pleural effusions and mild bibasilar atelectasis.  Very large hiatal hernia..  GI following and appreciate input and recommendations.  5.  Prior history of subarachnoid hemorrhage Stable.  6.  Coronary artery disease Stable.  Continue home regimen of digoxin and Cardizem, Aldactone.  7.  Hypothyroidism Continue Synthroid.   8.  Chronic kidney disease stage III Stable.  9.  Dementia Stable.   DVT prophylaxis: Heparin Code Status: Full Family Communication: Updated patient.  No family at bedside. Disposition:   Status is: Inpatient    Dispo: The patient is from: Home              Anticipated d/c is to: Home              Anticipated d/c date is: To be determined.              Patient currently being evaluated for melanotic stool/anemia.        Consultants:   Gastroenterology: Dr. Rush Landmark 03/26/2020  Procedures:   CT abdomen and pelvis 03/27/2020.  Chest x-ray 03/27/2020  Upper endoscopy 03/27/2020  Colonoscopy 03/27/2020  Antimicrobials:   IV Unasyn 03/27/2020   Subjective: Patient sitting up in bed.  States she is feeling better.  Denies any chest pain no shortness of breath.  Denies any bleeding.  Objective: Vitals:   03/27/20 2116 03/28/20 0418 03/28/20 0420 03/28/20 1108  BP: (!) 111/46 (!) 130/43 (!) 133/47   Pulse: 75 76 70 89  Resp:  18  16  Temp:  98.2 F (36.8 C)    TempSrc:  Oral    SpO2:  99%  98%  Weight:  57.1 kg    Height:        Intake/Output Summary (Last 24 hours) at 03/28/2020 1215 Last data filed at 03/28/2020 0230 Gross per 24 hour  Intake 300 ml  Output 550 ml  Net -250 ml   Filed Weights   03/26/20 0328 03/26/20 0413 03/28/20 0418  Weight: 58 kg 58 kg 57.1 kg    Examination:  General exam: NAD. Respiratory system: CTAB.  No wheezes, no crackles, no  rhonchi.  Normal respiratory effort.  Cardiovascular system: Irregularly irregular.  No JVD.  No lower extremity edema. Gastrointestinal system: Abdomen is soft, nontender, nondistended, positive bowel sounds.  No rebound.  No guarding. Central nervous system: Alert and oriented. No focal neurological deficits. Extremities: Symmetric 5 x 5 power. Skin: No rashes, lesions or ulcers Psychiatry: Judgement and insight appear normal. Mood & affect appropriate.     Data Reviewed: I have personally reviewed following labs and imaging studies  CBC: Recent Labs  Lab 03/26/20 0533 03/26/20 0813 03/26/20 1629 03/27/20 0405 03/28/20 0827  WBC 4.7 4.6  --  4.9 5.0  NEUTROABS  --   --   --   --  2.5  HGB 7.8* 7.6* 8.8* 8.3* 8.4*  HCT 24.5* 24.2* 27.7* 25.9* 26.1*  MCV 88.1 90.0  --  89.3 86.7  PLT 173 150  --  168 [id number]    Basic Metabolic Panel: Recent Labs  Lab 03/26/20 0533 03/27/20 0405 03/28/20 0827  NA 137 137 137  K 3.8 4.4 3.5  CL 107 109 106  CO2 24 22 21*  GLUCOSE 90 99 91  BUN 8 6* <5*  CREATININE 1.11* 1.11* 1.18*  CALCIUM 8.1* 8.0* 7.9*  MG 1.6* 2.1 1.8    GFR: Estimated Creatinine Clearance: 29.6 mL/min (A) (by C-G formula based on SCr of 1.18 mg/dL (H)).  Liver Function Tests: Recent Labs  Lab 03/26/20 0533  AST 20  ALT 15  ALKPHOS 63  BILITOT 0.4  PROT 4.6*  ALBUMIN 2.2*    CBG: Recent Labs  Lab 03/27/20 0054 03/27/20 1558 03/27/20 2332 03/28/20 0724  GLUCAP 94 80 87 80     Recent Results (from the past 240 hour(s))  SARS Coronavirus 2 by RT PCR (hospital order, performed in [facility] hospital lab) Nasopharyngeal Nasopharyngeal Swab     Status: None   Collection Time: 03/26/20  6:23 AM   Specimen: Nasopharyngeal Swab  Result Value Ref Range Status   SARS Coronavirus 2 NEGATIVE NEGATIVE Final    Comment: (NOTE) SARS-CoV-2 target nucleic acids are NOT DETECTED. The SARS-CoV-2 RNA is generally detectable in upper and lower respiratory  specimens during the acute phase of infection. The lowest concentration of SARS-CoV-2 viral copies this assay can detect is 250 copies / mL. A negative result does not preclude SARS-CoV-2 infection and should not be used as the sole basis for treatment or other patient management decisions.  A negative result may occur with improper specimen collection / handling, submission of specimen other than nasopharyngeal swab, presence of viral mutation(s) within the areas targeted by this assay, and inadequate number of viral copies (<250 copies / mL). A negative result must be combined with clinical observations, patient history, and epidemiological information. Fact Sheet for Patients:   StrictlyIdeas.no Fact Sheet for Healthcare Providers: BankingDealers.co.za This test is not yet approved or cleared  by the [place] FDA and has been authorized for detection and/or diagnosis of SARS-CoV-2 by FDA under an Emergency Use Authorization (EUA).  This EUA will remain in effect (meaning this test can be used) for the duration of the COVID-19 declaration under Section 564(b)(1) of the Act, 21 U.S.C. section 360bbb-3(b)(1), unless the authorization is terminated or revoked sooner. Performed at [facility], [place] [address], [place], [place] [address]          Radiology Studies: CT ABDOMEN PELVIS W CONTRAST  Result Date: 03/27/2020 CLINICAL DATA:  86 year old female with GI bleeding and descending colonic mass noted on colonoscopy today. EXAM: CT ABDOMEN AND PELVIS WITH CONTRAST TECHNIQUE: Multidetector CT imaging of the abdomen and pelvis was performed using the standard protocol following bolus administration of intravenous contrast. CONTRAST:  32mL OMNIPAQUE IOHEXOL 300 MG/ML  SOLN COMPARISON:  10/19/2016 abdominal/pelvic CT, 12/12/2019 chest CT and other studies. FINDINGS: Lower chest: Small bilateral pleural effusions and mild bibasilar  atelectasis are new since 12/12/2019. A very large hiatal hernia with majority of the stomach noted intrathoracic. Hepatobiliary: No significant hepatic or gallbladder abnormalities are noted. No focal hepatic lesions are identified. No biliary dilatation. Pancreas: Unremarkable Spleen: Unremarkable Adrenals/Urinary Tract: The kidneys, adrenal glands and bladder are unremarkable except for bilateral renal cysts. Stomach/Bowel: There is slightly irregular circumferential wall thickening of the colon at the splenic flexure measuring at least 10 cm in length. This likely represents the mass identified on colonoscopy. There is an 8 mm lymph node between the colon and liver (series 3: Image 16) which is indeterminate but not definitely present on  prior abdominal CTs. No other definite areas of bowel wall thickening are identified. There is no evidence of bowel obstruction or pneumoperitoneum. Vascular/Lymphatic: Aortic atherosclerotic calcifications are identified. There is no evidence of abdominal aortic aneurysm. Aorto bi-iliac stents are present. No other abnormal lymph nodes are identified. Reproductive: Status post hysterectomy. No adnexal masses. Other: No ascites or peritoneal/omental abnormality. Musculoskeletal: 6 no acute or suspicious bony abnormalities. IMPRESSION: 1. Circumferential wall thickening of the colon at the splenic flexure measuring at least 10 cm in length, likely representing the mass/malignancy identified on colonoscopy. An 8 mm lymph node between the colon and liver, not definitely present on the 2017 abdominal CT and suspicious for a lymph node metastasis. 2. No other evidence of metastatic disease within the abdomen or pelvis. 3. Small bilateral pleural effusions and mild bibasilar atelectasis. 4. Very large hiatal hernia. 5. Aortic Atherosclerosis (ICD10-I70.0). Electronically Signed   By: Margarette Canada M.D.   On: 03/27/2020 15:42   DG CHEST PORT 1 VIEW  Result Date: 03/27/2020 CLINICAL  DATA:  SOB EXAM: PORTABLE CHEST 1 VIEW COMPARISON:  Chest radiograph 12/12/2019 FINDINGS: Stable cardiomediastinal contours. Aortic arch calcification. There are new patchy airspace opacities in the left upper lung. There are hazy opacities at the left lung base which may reflect a combination of atelectasis and pleural fluid. There is a bandlike opacity in the medial right lung base favored to represent atelectasis. No pneumothorax. Possible small volume left pleural fluid. No acute finding in the visualized skeleton. IMPRESSION: 1. New patchy airspace opacities in the left upper lung raising concern for infection. 2. Probable bibasilar atelectasis and left pleural effusion. Radiographic follow-up is recommended in 3-4 weeks to ensure resolution. Electronically Signed   By: Audie Pinto M.D.   On: 03/27/2020 16:57        Scheduled Meds:  digoxin  0.125 mg Oral Daily   diltiazem  120 mg Oral Daily   DULoxetine  30 mg Oral Daily   fluticasone furoate-vilanterol  1 puff Inhalation Daily   And   umeclidinium bromide  1 puff Inhalation Daily   levothyroxine  37.5 mcg Oral QAC breakfast   mirtazapine  7.5 mg Oral QHS   pantoprazole  40 mg Oral BID   potassium chloride  40 mEq Oral Once   rosuvastatin  20 mg Oral Daily   spironolactone  25 mg Oral Daily   Continuous Infusions:  ampicillin-sulbactam (UNASYN) IV 3 g (03/28/20 0830)   heparin 700 Units/hr (03/27/20 2147)   magnesium sulfate bolus IVPB       LOS: 2 days    Time spent: 35 minutes    Irine Seal, MD [facility]   To contact the attending provider between 7A-7P or the covering provider during after hours 7P-7A, please log into the web site [email or web address] and access using universal [place] password for that web site. If you do not have the password, please call the hospital operator.  03/28/2020, 12:15 PM

## 2020-03-28 NOTE — Progress Notes (Signed)
Progress Note   Subjective  Chief Complaint: Anemia, hematochezia  Colonoscopy 03/27/2020 with hemorrhoids and skin tags found on digital rectal exam, tortuous colon, rule out malignancy, polypoid lesion in the descending colon biopsied and tattooed, 1 4 mm polyp in the sigmoid colon removed with cold snare, staple found in the mid rectum and removed, possible colo-coloanastomosis, nonbleeding nonthrombosed external and internal hemorrhoids  CTAP 03/27/2020 with circumferential wall thickening of the colon at the splenic flexure measuring at least 10 cm in length letter representing mass/malignancy identified on colonoscopy, 8 mm lymph node between the colon the liver, not definitely present on the 2017 abdominal CT and suspicious for lymph node met, no evidence of metastatic disease in the abdomen or pelvis, small bilateral pleural effusions and mild bibasilar atelectasis, very large hiatal hernia, aortic atherosclerosis  CEA pending  Today, the patient tells me that she is feeling better.  She passed a very small stool while urinating this morning with no blood.  Tolerating her diet but tells me that she went to a coughing fit this morning while eating breakfast and ended up not being able to eat much, is encouraged at lunch is coming soon.  Denies any other questions or concerns.   Objective   Vital signs in last 24 hours: Temp:  [98.2 F (36.8 C)-99.8 F (37.7 C)] 98.2 F (36.8 C) (05/30 0418) Pulse Rate:  [70-125] 89 (05/30 1108) Resp:  [16-20] 16 (05/30 1108) BP: (111-172)/(43-80) 133/47 (05/30 0420) SpO2:  [97 %-99 %] 98 % (05/30 1108) Weight:  [57.1 kg] 57.1 kg (05/30 0418) Last BM Date: 03/28/20 General:    Elderly white female in NAD Heart:  Regular rate and rhythm; no murmurs Lungs: Respirations even and unlabored, lungs CTA bilaterally Abdomen:  Soft, nontender and nondistended. Normal bowel sounds. Extremities:  Without edema. Neurologic:  Alert and oriented,  grossly  normal neurologically. Psych:  Cooperative. Normal mood and affect.  Intake/Output from previous day: 05/29 0701 - 05/30 0700 In: 1160 [P.O.:660; I.V.:500] Out: 550 [Urine:550]  Lab Results: Recent Labs    03/26/20 0813 03/26/20 0813 03/26/20 1629 03/27/20 0405 03/28/20 0827  WBC 4.6  --   --  4.9 5.0  HGB 7.6*   < > 8.8* 8.3* 8.4*  HCT 24.2*   < > 27.7* 25.9* 26.1*  PLT 150  --   --  168 156   < > = values in this interval not displayed.   BMET Recent Labs    03/26/20 0533 03/27/20 0405 03/28/20 0827  NA 137 137 137  K 3.8 4.4 3.5  CL 107 109 106  CO2 24 22 21*  GLUCOSE 90 99 91  BUN 8 6* <5*  CREATININE 1.11* 1.11* 1.18*  CALCIUM 8.1* 8.0* 7.9*   LFT Recent Labs    03/26/20 0533  PROT 4.6*  ALBUMIN 2.2*  AST 20  ALT 15  ALKPHOS 63  BILITOT 0.4   PT/INR Recent Labs    03/26/20 0533  LABPROT 15.1  INR 1.2    Studies/Results: CT ABDOMEN PELVIS W CONTRAST  Result Date: 03/27/2020 CLINICAL DATA:  86 year old female with GI bleeding and descending colonic mass noted on colonoscopy today. EXAM: CT ABDOMEN AND PELVIS WITH CONTRAST TECHNIQUE: Multidetector CT imaging of the abdomen and pelvis was performed using the standard protocol following bolus administration of intravenous contrast. CONTRAST:  76m OMNIPAQUE IOHEXOL 300 MG/ML  SOLN COMPARISON:  10/19/2016 abdominal/pelvic CT, 12/12/2019 chest CT and other studies. FINDINGS: Lower chest: Small bilateral pleural  effusions and mild bibasilar atelectasis are new since 12/12/2019. A very large hiatal hernia with majority of the stomach noted intrathoracic. Hepatobiliary: No significant hepatic or gallbladder abnormalities are noted. No focal hepatic lesions are identified. No biliary dilatation. Pancreas: Unremarkable Spleen: Unremarkable Adrenals/Urinary Tract: The kidneys, adrenal glands and bladder are unremarkable except for bilateral renal cysts. Stomach/Bowel: There is slightly irregular circumferential wall  thickening of the colon at the splenic flexure measuring at least 10 cm in length. This likely represents the mass identified on colonoscopy. There is an 8 mm lymph node between the colon and liver (series 3: Image 16) which is indeterminate but not definitely present on prior abdominal CTs. No other definite areas of bowel wall thickening are identified. There is no evidence of bowel obstruction or pneumoperitoneum. Vascular/Lymphatic: Aortic atherosclerotic calcifications are identified. There is no evidence of abdominal aortic aneurysm. Aorto bi-iliac stents are present. No other abnormal lymph nodes are identified. Reproductive: Status post hysterectomy. No adnexal masses. Other: No ascites or peritoneal/omental abnormality. Musculoskeletal: 6 no acute or suspicious bony abnormalities. IMPRESSION: 1. Circumferential wall thickening of the colon at the splenic flexure measuring at least 10 cm in length, likely representing the mass/malignancy identified on colonoscopy. An 8 mm lymph node between the colon and liver, not definitely present on the 2017 abdominal CT and suspicious for a lymph node metastasis. 2. No other evidence of metastatic disease within the abdomen or pelvis. 3. Small bilateral pleural effusions and mild bibasilar atelectasis. 4. Very large hiatal hernia. 5. Aortic Atherosclerosis (ICD10-I70.0). Electronically Signed   By: Margarette Canada M.D.   On: 03/27/2020 15:42   DG CHEST PORT 1 VIEW  Result Date: 03/27/2020 CLINICAL DATA:  SOB EXAM: PORTABLE CHEST 1 VIEW COMPARISON:  Chest radiograph 12/12/2019 FINDINGS: Stable cardiomediastinal contours. Aortic arch calcification. There are new patchy airspace opacities in the left upper lung. There are hazy opacities at the left lung base which may reflect a combination of atelectasis and pleural fluid. There is a bandlike opacity in the medial right lung base favored to represent atelectasis. No pneumothorax. Possible small volume left pleural fluid.  No acute finding in the visualized skeleton. IMPRESSION: 1. New patchy airspace opacities in the left upper lung raising concern for infection. 2. Probable bibasilar atelectasis and left pleural effusion. Radiographic follow-up is recommended in 3-4 weeks to ensure resolution. Electronically Signed   By: Audie Pinto M.D.   On: 03/27/2020 16:57       Assessment / Plan:   Assessment: 1.  FOBT positive anemia: Colonoscopy 03/27/2020 with question of possible malignancy, biopsies pending 2.  Eliquis therapy pre and post cardioversion within the last 3 weeks, currently on hold 3.  A. fib  Plan: 1.  CEA pending.  Expect pathology from procedures will take another 1 to 2 days. 2.  Continue diet as tolerated 3.  We will sign off today, pathology results will need to be followed up on.  Please await any final recommendations from Dr. Rush Landmark.  Thank you for kind consultation.    LOS: 2 days   Levin Erp  03/28/2020, 11:32 AM

## 2020-03-29 LAB — GLUCOSE, CAPILLARY
Glucose-Capillary: 97 mg/dL (ref 70–99)
Glucose-Capillary: 94 mg/dL (ref 70–99)

## 2020-03-29 LAB — BASIC METABOLIC PANEL
Anion gap: 9 (ref 5–15)
BUN: <5 mg/dL — ABNORMAL LOW (ref 8–23)
CO2: 21 mmol/L — ABNORMAL LOW (ref 22–32)
Calcium: 7.8 mg/dL — ABNORMAL LOW (ref 8.9–10.3)
Chloride: 107 mmol/L (ref 98–111)
Creatinine, Ser: 1.2 mg/dL — ABNORMAL HIGH (ref 0.44–1.00)
GFR calc Af Amer: 47 mL/min — ABNORMAL LOW (ref >60)
GFR calc non Af Amer: 41 mL/min — ABNORMAL LOW (ref >60)
Glucose, Bld: 88 mg/dL (ref 70–99)
Potassium: 3.7 mmol/L (ref 3.5–5.1)
Sodium: 137 mmol/L (ref 135–145)

## 2020-03-29 LAB — CBC
HCT: 23.2 % — ABNORMAL LOW (ref 36.0–46.0)
Hemoglobin: 7.6 g/dL — ABNORMAL LOW (ref 12.0–15.0)
MCH: 28.5 pg (ref 26.0–34.0)
MCHC: 32.8 g/dL (ref 30.0–36.0)
MCV: 86.9 fL (ref 80.0–100.0)
Platelets: 149 10*3/uL — ABNORMAL LOW (ref 150–400)
RBC: 2.67 MIL/uL — ABNORMAL LOW (ref 3.87–5.11)
RDW: 13.9 % (ref 11.5–15.5)
WBC: 3.6 10*3/uL — ABNORMAL LOW (ref 4.0–10.5)
nRBC: 0 % (ref 0.0–0.2)

## 2020-03-29 LAB — CEA: CEA: 3 ng/mL (ref 0.0–4.7)

## 2020-03-29 LAB — HEPARIN LEVEL (UNFRACTIONATED): Heparin Unfractionated: 0.33 IU/mL (ref 0.30–0.70)

## 2020-03-29 LAB — MAGNESIUM: Magnesium: 1.9 mg/dL (ref 1.7–2.4)

## 2020-03-29 MED ORDER — FUROSEMIDE 10 MG/ML IJ SOLN
20.0000 mg | Freq: Once | INTRAMUSCULAR | Status: AC
  Administered 2020-03-29: 20 mg via INTRAVENOUS
  Filled 2020-03-29: qty 2

## 2020-03-29 MED ORDER — APIXABAN 2.5 MG PO TABS
2.5000 mg | ORAL_TABLET | Freq: Two times a day (BID) | ORAL | Status: DC
  Administered 2020-03-29 – 2020-03-30 (×2): 2.5 mg via ORAL
  Filled 2020-03-29 (×3): qty 1

## 2020-03-29 MED ORDER — POTASSIUM CHLORIDE CRYS ER 20 MEQ PO TBCR
20.0000 meq | EXTENDED_RELEASE_TABLET | Freq: Once | ORAL | Status: AC
  Administered 2020-03-29: 20 meq via ORAL
  Filled 2020-03-29: qty 1

## 2020-03-29 NOTE — Progress Notes (Addendum)
PROGRESS NOTE    Jennifer Osborne  [id number] DOB: [birth year] DOA: 03/26/2020 PCP: [name], NP    No chief complaint on file.   Brief Narrative:  HPI per Dr. [name] is a 86 y.o. female with history of A. fib who underwent recent cardioversion for weakness CAD COPD chronic kidney disease stage III anemia was admitted at [facility] on Mar 23, 2020 for hypocalcemia and hypokalemia and generalized weakness.  Patient had potassium and calcium replaced.  Eventually patient's hemoglobin dropped from 10-7.8 and stool for occult blood was positive.  Patient was transferred to [facility] for GI bleed in the setting of the use of apixaban.  Patient states she has been noticing some black stool for the last 1 week.  Denies taking any NSAIDs.  Denies chest pain or shortness of breath.  Patient is afebrile.  Labs done on May 26 show hemoglobin of 7.8 creatinine 1.1 bicarb 24 potassium 3.7 platelets 203 WBC 4.9.  ED Course: Patient is a direct admit.  Assessment & Plan:   Principal Problem:   Acute GI bleeding Active Problems:   Chronic coronary artery disease   Late onset Alzheimer's disease without behavioral disturbance (HCC)   Paroxysmal atrial fibrillation (HCC)   Subarachnoid hemorrhage (HCC)   Essential hypertension   Acute blood loss anemia   SOB (shortness of breath)   Pneumonia of left upper lobe due to infectious organism  1 acute blood loss anemia/acute GI bleed Patient presented with melanotic stools with a decreasing hemoglobin in the setting of anticoagulation for Eliquis for atrial fibrillation.  Hemoglobin on presentation was 7.8 from 10.8 on 03/08/2020.  Hemoglobin currently this morning from 8.4.  Patient s/p upper endoscopy which showed a large hiatal hernia with no signs of bleeding noted.  Patient subsequently underwent a colonoscopy with polyploid lesion found in the descending colon, circumferential with narrowing of the lumen with  frond-like/villous infiltrative and polyploid oozing noted status post biopsies.  4 mm polyp also noted in the sigmoid colon which was sessile status post resection and retrieval, foreign body also noted in the mid rectum removal of a staple with regular forceps.  Nonbleeding nonthrombosed external/internal hemorrhoids also noted.  CT abdomen and pelvis ordered per GI consistent with circumferential polypoid lesion in descending colon as noted on colonoscopy concerning for mass/malignancy.  Patient resumed back on anticoagulation of heparin with no overt bleeding.  GI to advise when oral anticoagulation may be resumed.  GI following and appreciate input and recommendations.  2.  Paroxysmal A. fib Was called by RN this afternoon that patient with heart rates persistently in the 150s on 03/27/2020.  Patient noted not to have received any of her medications today.  Lopressor 5 mg IV x1.  Patient subsequently was placed on home regimen of Cardizem and digoxin for rate control. IV heparin for anticoagulation.  GI to advise when patient may be placed back on oral anticoagulation.  Outpatient follow-up with cardiology.    3.  Shortness of breath/?  Pneumonia Patient with some complaints of shortness of breath per RN while noted with heart rates in the 150s on 03/27/2020.  Chest x-ray ordered with concerns for new patchy airspace opacities in left upper lobe, probable bibasilar atelectasis and left pleural effusion. Patient noted to have some scattered coarse breath sounds with some minimal expiratory wheezing.  Patient noted to be +2.58 L during this hospitalization.  Current weight of 128.31 pounds from 127.87 pounds on admission.  Urine Legionella  antigen pending.  Urine pneumococcus antigen pending.  SLP pending.  IV Unasyn.  We will give Lasix 20 mg IV every 12 hours x2 doses.  Strict I's and O's.  Daily weights.  Supportive care.   4.  Abnormal colonoscopy Polypoid lesion noted on descending colon,  circumferential with narrowing of the lumen, frond-like/villous infiltrative and polypoid losing noted on colonoscopy status post biopsies.   CT abdomen and pelvis ordered per GI which showed a circumferential wall thickening of the colon at the splenic flexure measuring at least 10 cm in length likely representing mass/malignancy identified on colonoscopy.  8 mm lymph node between the colon and liver not present on 2017 abdominal CT suspicious for lymph node mets.  No other evidence of metastatic disease within the abdomen or pelvis.  Small bilateral pleural effusions and mild bibasilar atelectasis.  Very large hiatal hernia..  GI following and appreciate input and recommendations.  5.  Prior history of subarachnoid hemorrhage Stable.  6.  Coronary artery disease Continue home regimen digoxin, Cardizem, Aldactone.   7.  Hypothyroidism Synthroid.   8.  Chronic kidney disease stage III Stable.  9.  Dementia Stable.   DVT prophylaxis: Heparin Code Status: Full Family Communication: Updated patient.  Updated daughter via telephone.  Disposition:   Status is: Inpatient    Dispo: The patient is from: Home              Anticipated d/c is to: Home              Anticipated d/c date is: To be determined.              Patient currently being evaluated for melanotic stool/anemia.  Colonoscopy done biopsies pending.  Patient on IV antibiotics for probable pneumonia.        Consultants:   Gastroenterology: Dr. Rush Landmark 03/26/2020  Procedures:   CT abdomen and pelvis 03/27/2020.  Chest x-ray 03/27/2020  Upper endoscopy 03/27/2020  Colonoscopy 03/27/2020  Antimicrobials:   IV Unasyn 03/27/2020   Subjective: Patient laying in bed.  Complaining of some nausea.  Denies any chest pain.  Denies any shortness of breath.  Denies any melanotic stools.  Stated had a small bowel movement.   Objective: Vitals:   03/28/20 2103 03/29/20 0431 03/29/20 0500 03/29/20 0813  BP: (!) 125/56  (!) 138/53    Pulse: 75 84    Resp: 16 17    Temp: 98.2 F (36.8 C) 98.6 F (37 C)    TempSrc: Oral Oral    SpO2: 98% 99%  99%  Weight:   58.2 kg   Height:        Intake/Output Summary (Last 24 hours) at 03/29/2020 1242 Last data filed at 03/29/2020 0900 Gross per 24 hour  Intake 1775.11 ml  Output 950 ml  Net 825.11 ml   Filed Weights   03/26/20 0413 03/28/20 0418 03/29/20 0500  Weight: 58 kg 57.1 kg 58.2 kg    Examination:  General exam: NAD. Respiratory system: Minimal expiratory wheezing, some scattered coarse breath sounds.  No use of accessory muscles of respiration.  Cardiovascular system: Irregularly irregular.  No JVD.  No lower extremity edema.  Gastrointestinal system: Abdomen is nontender, nondistended, soft, positive bowel sounds.  No rebound.  No guarding. Central nervous system: Alert and oriented. No focal neurological deficits. Extremities: Symmetric 5 x 5 power. Skin: No rashes, lesions or ulcers Psychiatry: Judgement and insight appear normal. Mood & affect appropriate.     Data Reviewed: I have  personally reviewed following labs and imaging studies  CBC: Recent Labs  Lab 03/26/20 0533 03/26/20 0533 03/26/20 0813 03/26/20 1629 03/27/20 0405 03/28/20 0827 03/29/20 0208  WBC 4.7  --  4.6  --  4.9 5.0 3.6*  NEUTROABS  --   --   --   --   --  2.5  --   HGB 7.8*   < > 7.6* 8.8* 8.3* 8.4* 7.6*  HCT 24.5*   < > 24.2* 27.7* 25.9* 26.1* 23.2*  MCV 88.1  --  90.0  --  89.3 86.7 86.9  PLT 173  --  150  --  168 156 149*   < > = values in this interval not displayed.    Basic Metabolic Panel: Recent Labs  Lab 03/26/20 0533 03/27/20 0405 03/28/20 0827 03/29/20 0208  NA 137 137 137 137  K 3.8 4.4 3.5 3.7  CL 107 109 106 107  CO2 24 22 21* 21*  GLUCOSE 90 99 91 88  BUN 8 6* <5* <5*  CREATININE 1.11* 1.11* 1.18* 1.20*  CALCIUM 8.1* 8.0* 7.9* 7.8*  MG 1.6* 2.1 1.8 1.9    GFR: Estimated Creatinine Clearance: 29.1 mL/min (A) (by C-G formula  based on SCr of 1.2 mg/dL (H)).  Liver Function Tests: Recent Labs  Lab 03/26/20 0533  AST 20  ALT 15  ALKPHOS 63  BILITOT 0.4  PROT 4.6*  ALBUMIN 2.2*    CBG: Recent Labs  Lab 03/27/20 2332 03/28/20 0724 03/28/20 1719 03/28/20 2357 03/29/20 0738  GLUCAP 87 80 90 86 97     Recent Results (from the past 240 hour(s))  SARS Coronavirus 2 by RT PCR (hospital order, performed in [facility] hospital lab) Nasopharyngeal Nasopharyngeal Swab     Status: None   Collection Time: 03/26/20  6:23 AM   Specimen: Nasopharyngeal Swab  Result Value Ref Range Status   SARS Coronavirus 2 NEGATIVE NEGATIVE Final    Comment: (NOTE) SARS-CoV-2 target nucleic acids are NOT DETECTED. The SARS-CoV-2 RNA is generally detectable in upper and lower respiratory specimens during the acute phase of infection. The lowest concentration of SARS-CoV-2 viral copies this assay can detect is 250 copies / mL. A negative result does not preclude SARS-CoV-2 infection and should not be used as the sole basis for treatment or other patient management decisions.  A negative result may occur with improper specimen collection / handling, submission of specimen other than nasopharyngeal swab, presence of viral mutation(s) within the areas targeted by this assay, and inadequate number of viral copies (<250 copies / mL). A negative result must be combined with clinical observations, patient history, and epidemiological information. Fact Sheet for Patients:   StrictlyIdeas.no Fact Sheet for Healthcare Providers: BankingDealers.co.za This test is not yet approved or cleared  by the [place] FDA and has been authorized for detection and/or diagnosis of SARS-CoV-2 by FDA under an Emergency Use Authorization (EUA).  This EUA will remain in effect (meaning this test can be used) for the duration of the COVID-19 declaration under Section 564(b)(1) of the Act, 21  U.S.C. section 360bbb-3(b)(1), unless the authorization is terminated or revoked sooner. Performed at [facility], [place] [address] [place],  [address]          Radiology Studies: CT ABDOMEN PELVIS W CONTRAST  Result Date: 03/27/2020 CLINICAL DATA:  86 year old female with GI bleeding and descending colonic mass noted on colonoscopy today. EXAM: CT ABDOMEN AND PELVIS WITH CONTRAST TECHNIQUE: Multidetector CT imaging of the  abdomen and pelvis was performed using the standard protocol following bolus administration of intravenous contrast. CONTRAST:  74mL OMNIPAQUE IOHEXOL 300 MG/ML  SOLN COMPARISON:  10/19/2016 abdominal/pelvic CT, 12/12/2019 chest CT and other studies. FINDINGS: Lower chest: Small bilateral pleural effusions and mild bibasilar atelectasis are new since 12/12/2019. A very large hiatal hernia with majority of the stomach noted intrathoracic. Hepatobiliary: No significant hepatic or gallbladder abnormalities are noted. No focal hepatic lesions are identified. No biliary dilatation. Pancreas: Unremarkable Spleen: Unremarkable Adrenals/Urinary Tract: The kidneys, adrenal glands and bladder are unremarkable except for bilateral renal cysts. Stomach/Bowel: There is slightly irregular circumferential wall thickening of the colon at the splenic flexure measuring at least 10 cm in length. This likely represents the mass identified on colonoscopy. There is an 8 mm lymph node between the colon and liver (series 3: Image 16) which is indeterminate but not definitely present on prior abdominal CTs. No other definite areas of bowel wall thickening are identified. There is no evidence of bowel obstruction or pneumoperitoneum. Vascular/Lymphatic: Aortic atherosclerotic calcifications are identified. There is no evidence of abdominal aortic aneurysm. Aorto bi-iliac stents are present. No other abnormal lymph nodes are identified. Reproductive: Status post hysterectomy. No adnexal masses.  Other: No ascites or peritoneal/omental abnormality. Musculoskeletal: 6 no acute or suspicious bony abnormalities. IMPRESSION: 1. Circumferential wall thickening of the colon at the splenic flexure measuring at least 10 cm in length, likely representing the mass/malignancy identified on colonoscopy. An 8 mm lymph node between the colon and liver, not definitely present on the 2017 abdominal CT and suspicious for a lymph node metastasis. 2. No other evidence of metastatic disease within the abdomen or pelvis. 3. Small bilateral pleural effusions and mild bibasilar atelectasis. 4. Very large hiatal hernia. 5. Aortic Atherosclerosis (ICD10-I70.0). Electronically Signed   By: Margarette Canada M.D.   On: 03/27/2020 15:42   DG CHEST PORT 1 VIEW  Result Date: 03/27/2020 CLINICAL DATA:  SOB EXAM: PORTABLE CHEST 1 VIEW COMPARISON:  Chest radiograph 12/12/2019 FINDINGS: Stable cardiomediastinal contours. Aortic arch calcification. There are new patchy airspace opacities in the left upper lung. There are hazy opacities at the left lung base which may reflect a combination of atelectasis and pleural fluid. There is a bandlike opacity in the medial right lung base favored to represent atelectasis. No pneumothorax. Possible small volume left pleural fluid. No acute finding in the visualized skeleton. IMPRESSION: 1. New patchy airspace opacities in the left upper lung raising concern for infection. 2. Probable bibasilar atelectasis and left pleural effusion. Radiographic follow-up is recommended in 3-4 weeks to ensure resolution. Electronically Signed   By: Audie Pinto M.D.   On: 03/27/2020 16:57        Scheduled Meds: . digoxin  0.125 mg Oral Daily  . diltiazem  120 mg Oral Daily  . DULoxetine  30 mg Oral Daily  . fluticasone furoate-vilanterol  1 puff Inhalation Daily   And  . umeclidinium bromide  1 puff Inhalation Daily  . furosemide  20 mg Intravenous Once  . levothyroxine  37.5 mcg Oral QAC breakfast  .  mirtazapine  7.5 mg Oral QHS  . pantoprazole  40 mg Oral BID  . rosuvastatin  20 mg Oral Daily  . spironolactone  25 mg Oral Daily   Continuous Infusions: . ampicillin-sulbactam (UNASYN) IV 3 g (03/29/20 0732)  . heparin 750 Units/hr (03/29/20 0730)     LOS: 3 days    Time spent: 35 minutes    Irine Seal, MD [facility]   To contact the attending provider between 7A-7P or the covering provider during after hours 7P-7A, please log into the web site [email or web address] and access using universal [place] password for that web site. If you do not have the password, please call the hospital operator.  03/29/2020, 12:42 PM

## 2020-03-29 NOTE — Plan of Care (Signed)
  Problem: Education: Goal: Knowledge of General Education information will improve Description: Including pain rating scale, medication(s)/side effects and non-pharmacologic comfort measures Outcome: Progressing   Problem: Activity: Goal: Risk for activity intolerance will decrease Outcome: Progressing   Problem: Coping: Goal: Level of anxiety will decrease Outcome: Progressing   

## 2020-03-29 NOTE — Progress Notes (Signed)
[facility] for heparin  Indication: atrial fibrillation  Allergies  Allergen Reactions  . Morphine And Related Itching    Pt states she had itching, no rash  . Meperidine Nausea Only  . Pregabalin Other (See Comments)    Caused stroke    Patient Measurements: Height: 5\' 4"  (162.6 cm) Weight: 58.2 kg (128 lb 4.9 oz) IBW/kg (Calculated) : 54.7 Heparin Dosing Weight: 58kg  Vital Signs: Temp: 98.6 F (37 C) (05/31 0431) Temp Source: Oral (05/31 0431) BP: 138/53 (05/31 0431) Pulse Rate: 84 (05/31 0431)  Labs: Recent Labs    03/27/20 0405 03/27/20 0405 03/28/20 0827 03/28/20 1812 03/29/20 0208  HGB 8.3*   < > 8.4*  --  7.6*  HCT 25.9*  --  26.1*  --  23.2*  PLT 168  --  156  --  149*  APTT  --   --  135* 139*  --   HEPARINUNFRC  --   --  0.37 0.42 0.33  CREATININE 1.11*  --  1.18*  --  1.20*   < > = values in this interval not displayed.    Estimated Creatinine Clearance: 29.1 mL/min (A) (by C-G formula based on SCr of 1.2 mg/dL (H)).   Medical History: Past Medical History:  Diagnosis Date  . Abnormal weight loss   . Ambulatory dysfunction   . Asthma   . Atrial fibrillation ([place])   . Basilar artery stenosis   . Bile reflux gastritis   . BMI 21.0-21.9, adult   . Brain bleed ([place])    history in 2019  . Cerumen impaction   . Chronic constipation   . Chronic nausea   . COPD (chronic obstructive pulmonary disease) ([place])   . Degenerative disc disease, lumbar   . Depression with anxiety   . Diastolic CHF ([place])   . Dysphagia   . External hemorrhoids   . Frequent falls   . Generalized anxiety disorder   . GERD (gastroesophageal reflux disease)   . Hiatal hernia   . Hyperlipemia   . Hyperlipemia   . Hypothyroidism   . Insomnia   . Internal hemorrhoids   . Iron deficiency anemia   . Major depression   . Migraine   . Neurological disorder    Huntington's Similar  . Osteoarthritis, generalized   . PAD (peripheral  artery disease) ([place])   . Paroxysmal atrial fibrillation (H[facility])   . PVD (peripheral vascular disease) ([place])   . Radiculopathy    left lower leg  . Sigmoid diverticulosis   . SOB (shortness of breath)   . [place] dance   . Systemic lupus erythematosus (SLE) in remission ([place])   . Urinary incontinence   . Urinary tract infection   . Vascular dementia without behavioral disturbance ([place])   . Vitamin B 12 deficiency   . Vitamin D deficiency   . Weakness     Assessment: 86 year old female with history of afib on apixaban, last dose appears to be on 5/25.   S/p endoscopy which was unremarkable followed by colonoscopy with polyp removal, polypoid lesion that was biopsied to rule out malignancy and foreign body removal. Pharmacy consulted to start heparin infusion 12 hours post-procedure without bolus.  Heparin level at goal on the lower end at 0.33. Hemoglobin slightly lower at 7.6, platelet wnl. No bleeding reported per RN.  Goal of Therapy:  Heparin level 0.3-0.5 units/ml Monitor platelets by anticoagulation protocol: Yes   Plan:  Increase heparin infusion  rate to 750 units/hr Monitor daily heparin level, CBC, and s/sx of bleeding F/u plans for resuming oral anticoagulation  Berenice Bouton, PharmD PGY1 Pharmacy Resident  Please check AMION for all [place] phone numbers After 10:00 PM, call [place] [phone]  03/29/2020 7:22 AM

## 2020-03-29 NOTE — Progress Notes (Signed)
[facility] for heparin >> apixaban Indication: atrial fibrillation  Allergies  Allergen Reactions  . Morphine And Related Itching    Pt states she had itching, no rash  . Meperidine Nausea Only  . Pregabalin Other (See Comments)    Caused stroke    Patient Measurements: Height: 5\' 4"  ([id number] cm) Weight: 58.2 kg (128 lb 4.9 oz) IBW/kg (Calculated) : 54.7 Heparin Dosing Weight: 58kg  Vital Signs: Temp: 98.3 F (36.8 C) (05/31 1417) Temp Source: Oral (05/31 1417) BP: 117/62 (05/31 1417) Pulse Rate: 75 (05/31 1417)  Labs: Recent Labs    03/27/20 0405 03/27/20 0405 03/28/20 0827 03/28/20 1812 03/29/20 0208  HGB 8.3*   < > 8.4*  --  7.6*  HCT 25.9*  --  26.1*  --  23.2*  PLT 168  --  156  --  149*  APTT  --   --  135* 139*  --   HEPARINUNFRC  --   --  0.37 0.42 0.33  CREATININE 1.11*  --  1.18*  --  1.20*   < > = values in this interval not displayed.    Estimated Creatinine Clearance: 29.1 mL/min (A) (by C-G formula based on SCr of 1.2 mg/dL (H)).   Medical History: Past Medical History:  Diagnosis Date  . Abnormal weight loss   . Ambulatory dysfunction   . Asthma   . Atrial fibrillation ([place])   . Basilar artery stenosis   . Bile reflux gastritis   . BMI 21.0-21.9, adult   . Brain bleed ([place])    history in 2019  . Cerumen impaction   . Chronic constipation   . Chronic nausea   . COPD (chronic obstructive pulmonary disease) ([place])   . Degenerative disc disease, lumbar   . Depression with anxiety   . Diastolic CHF ([place])   . Dysphagia   . External hemorrhoids   . Frequent falls   . Generalized anxiety disorder   . GERD (gastroesophageal reflux disease)   . Hiatal hernia   . Hyperlipemia   . Hyperlipemia   . Hypothyroidism   . Insomnia   . Internal hemorrhoids   . Iron deficiency anemia   . Major depression   . Migraine   . Neurological disorder    Huntington's Similar  . Osteoarthritis, generalized   . PAD  (peripheral artery disease) ([place])   . Paroxysmal atrial fibrillation (HCC)   . PVD (peripheral vascular disease) ([place])   . Radiculopathy    left lower leg  . Sigmoid diverticulosis   . SOB (shortness of breath)   . [place] dance   . Systemic lupus erythematosus (SLE) in remission ([place])   . Urinary incontinence   . Urinary tract infection   . Vascular dementia without behavioral disturbance ([place])   . Vitamin B 12 deficiency   . Vitamin D deficiency   . Weakness     Assessment: 86 year old female with history of afib on apixaban, last dose appears to be on 5/25.   S/p endoscopy which was unremarkable followed by colonoscopy with polyp removal, polypoid lesion that was biopsied to rule out malignancy and foreign body removal. Pharmacy consulted to start heparin infusion 12 hours post-procedure without bolus.  Heparin level at goal on the lower end at 0.33 this morning. Hemoglobin slightly lower at 7.6, platelet wnl. No bleeding reported per RN. New orders this evening to change to apixaban, will stop heparin after apixaban given.   Goal of Therapy:  Heparin level 0.3-0.5 units/ml Monitor platelets by anticoagulation protocol: Yes   Plan:  Stop heparin once apixaban given Apixaban 2.5mg  bid  Erin Hearing PharmD., BCPS Clinical Pharmacist 03/29/2020 6:57 PM    Please check AMION for all [place] phone numbers After 10:00 PM, call [place] [phone]  03/29/2020 6:56 PM

## 2020-03-29 NOTE — Progress Notes (Addendum)
Occupational Therapy Evaluation Patient Details Name: Jennifer Osborne MRN: [id number] DOB: 08-05-1931 Today's Date: 03/29/2020    History of Present Illness 86 y.o. female with history of A. fib who underwent recent cardioversion for weakness CAD COPD chronic kidney disease stage III anemia was admitted at [facility] on Mar 23, 2020 for hypocalcemia and hypokalemia and generalized weakness. Pt expereincing GIB in hospital resulting in transfer to [facility]. Pt underwent colonoscopy on 5/29.   Clinical Impression   Prior to hospitalization, pt was independent with BADLs and dependent on daughter (who she lives with) for IADLs. Pt no longer drives secondary to short term memory loss. Pt occasionally required supervision for tub/shower transfer for safety at home. Pt admitted for above and limited by decreased balance, decreased activity tolerance, and decreased memory. Today, pt agreeable to OT evaluation, received semi-reclined in bed. Assessed bed mobility, functional transfers, toileting, and grooming. Pt performed stand pivot transfer from bed>BSC with supervision. Pt performed LB dressing and perineal hygiene with supervision and no difficulty in sitting/standing. Pt requires supervision for functional transfers/ambulation using RW for support. Pt stood at sink to perform grooming activities- brushing teeth/hair and washing face. No LOB noted. Pt would benefit from continued skilled OT services to address ADLs/functional mobility in the home. Pt confirms having all DME/AE applicable. Will continue to follow pt acutely as able.     Follow Up Recommendations  Home health OT;Supervision/Assistance - 24 hour    Equipment Recommendations  None recommended by OT    Recommendations for Other Services       Precautions / Restrictions Precautions Precautions: Fall Restrictions Weight Bearing Restrictions: No      Mobility Bed Mobility Overal bed mobility: Needs Assistance Bed Mobility:  Supine to Sit     Supine to sit: Supervision     General bed mobility comments: pt reports having a bed at home that allows [facility] to raise  Transfers Overall transfer level: Needs assistance Equipment used: Rolling walker (2 wheeled) Transfers: Sit to/from Stand Sit to Stand: Supervision              Balance Overall balance assessment: Needs assistance Sitting-balance support: No upper extremity supported;Feet supported Sitting balance-Leahy Scale: Good Sitting balance - Comments: supervision to don socks   Standing balance support: Bilateral upper extremity supported;During functional activity Standing balance-Leahy Scale: Good Standing balance comment: close supervision with UE support of RW                           ADL either performed or assessed with clinical judgement   ADL Overall ADL's : Needs assistance/impaired Eating/Feeding: Independent;Sitting   Grooming: Wash/dry hands;Wash/dry face;Oral care;Brushing hair;Supervision/safety;Cueing for safety;Standing   Upper Body Bathing: Supervision/ safety;Sitting   Lower Body Bathing: Min guard;Sitting/lateral leans;Sit to/from stand   Upper Body Dressing : Independent;Sitting   Lower Body Dressing: Supervision/safety;Sitting/lateral leans;Sit to/from stand   Toilet Transfer: Supervision/safety;Squat-pivot;BSC   Toileting- Water quality scientist and Hygiene: Independent;Sitting/lateral lean;Sit to/from stand   Scientist, research (medical): Supervision/safety;Ambulation;Shower seat;Grab bars;Rolling walker   Functional mobility during ADLs: Supervision/safety;Rolling walker General ADL Comments: pt able to perform all BADLs with supervision; no LOB noted; pt demonstrated good safety awareness by asking to use RW for support     Vision Baseline Vision/History: Wears glasses Wears Glasses: At all times Patient Visual Report: No change from baseline       Perception     Praxis      Pertinent Vitals/Pain  Pain  Assessment: No/denies pain     Hand Dominance Right   Extremity/Trunk Assessment Upper Extremity Assessment Upper Extremity Assessment: Generalized weakness   Lower Extremity Assessment Lower Extremity Assessment: Defer to PT evaluation   Cervical / Trunk Assessment Cervical / Trunk Assessment: Kyphotic   Communication Communication Communication: HOH   Cognition Arousal/Alertness: Awake/alert Behavior During Therapy: WFL for tasks assessed/performed Overall Cognitive Status: No family/caregiver present to determine baseline cognitive functioning Area of Impairment: Memory                     Memory: Decreased short-term memory         General Comments: pt somewhat aware of memory deficits; reports not being able to drive anymore because of getting lost easily   General Comments  v/c's for incorporating deep breathing during functional activity; less than a minute recovery time into low 90s for SPO2    Exercises     Shoulder Instructions      Home Living Family/patient expects to be discharged to:: Private residence Living Arrangements: Children Available Help at Discharge: Family;Available 24 hours/day Type of Home: House Home Access: Stairs to enter [employer] of Steps: 4 Entrance Stairs-Rails: Left Home Layout: One level     Bathroom Shower/Tub: Tub/shower unit;Walk-in shower   Bathroom Toilet: Handicapped height Bathroom Accessibility: Yes   Home Equipment: [place] - single point;Walker - 2 wheels;Bedside commode;Shower seat          Prior Functioning/Environment Level of Independence: Independent with assistive device(s)        Comments: pt ambulates independently in the home and utilizes a RW vs cane for community distances; pt performs all BADLs with Mod I using walker for support; family supervises for showering at times        OT Problem List: Decreased activity tolerance;Decreased strength;Impaired balance (sitting  and/or standing)      OT Treatment/Interventions:      OT Goals(Current goals can be found in the care plan section) Acute Rehab OT Goals Patient Stated Goal: To improve mobility and activity tolerance  OT Frequency: Min 2X/week   Barriers to D/C:            Co-evaluation              AM-PAC OT "6 Clicks" Daily Activity     Outcome Measure Help from another person eating meals?: None Help from another person taking care of personal grooming?: None Help from another person toileting, which includes using toliet, bedpan, or urinal?: None Help from another person bathing (including washing, rinsing, drying)?: A Little Help from another person to put on and taking off regular upper body clothing?: None Help from another person to put on and taking off regular lower body clothing?: None 6 Click Score: 23   End of Session Equipment Utilized During Treatment: Gait belt;Rolling walker Nurse Communication: Mobility status  Activity Tolerance: Patient tolerated treatment well Patient left: in chair;with call bell/phone within reach;with chair alarm set  OT Visit Diagnosis: Unsteadiness on feet (R26.81);Muscle weakness (generalized) (M62.81)                Time: [id number] OT Time Calculation (min): 34 min Charges:  OT General Charges $OT Visit: 1 Visit OT Evaluation $OT Eval Moderate Complexity: 1 Mod OT Treatments $Self Care/Home Management : 8-22 mins  Michel Bickers, OTR/L [facility] Acute Rehab Services [phone]   Francesca Jewett 03/29/2020, 5:28 PM

## 2020-03-29 NOTE — Evaluation (Signed)
Physical Therapy Evaluation Patient Details Name: Jennifer Osborne MRN: [id number] DOB: 1931/02/15 Today's Date: 03/29/2020   History of Present Illness  86 y.o. female with history of A. fib who underwent recent cardioversion for weakness CAD COPD chronic kidney disease stage III anemia was admitted at [facility] on Mar 23, 2020 for hypocalcemia and hypokalemia and generalized weakness. Pt expereincing GIB in hospital resulting in transfer to [facility]. Pt underwent colonoscopy on 5/29.  Clinical Impression  Pt presents to PT with deficits in functional mobility, gait, balance, activity tolerance, power, endurance. Pt does not require any physical assistance for mobility performed during session, however does fatigue quickly. Pt also demonstrates deficits in memory and cognition, often repeating questions recently asked without recollection. Pt will benefit from continued acute PT POC to improve activity tolerance and to restore independence in mobility. Pt amy benefit from gait training with use of 4 wheeled walker with seat if endurance deficits persist.    Follow Up Recommendations Home health PT;Supervision for mobility/OOB    Equipment Recommendations  None recommended by PT(consider 4 wheel walker with seat if tolerance remains low)    Recommendations for Other Services       Precautions / Restrictions Precautions Precautions: Fall Restrictions Weight Bearing Restrictions: No      Mobility  Bed Mobility Overal bed mobility: Needs Assistance Bed Mobility: Supine to Sit     Supine to sit: Supervision        Transfers Overall transfer level: Needs assistance Equipment used: Rolling walker (2 wheeled) Transfers: Sit to/from Stand Sit to Stand: Supervision            Ambulation/Gait Ambulation/Gait assistance: Supervision Gait Distance (Feet): 70 Feet Assistive device: Rolling walker (2 wheeled) Gait Pattern/deviations: Step-through pattern Gait velocity:  reduced Gait velocity interpretation: <1.8 ft/sec, indicate of risk for recurrent falls General Gait Details: pt with shortened stride length, otherwise no significant gait or balance deviations.  Stairs            Wheelchair Mobility    Modified Rankin (Stroke Patients Only)       Balance Overall balance assessment: Needs assistance Sitting-balance support: No upper extremity supported;Feet supported Sitting balance-Leahy Scale: Good Sitting balance - Comments: supervision to don socks   Standing balance support: Single extremity supported Standing balance-Leahy Scale: Good Standing balance comment: close supervision with UE support of RW                             Pertinent Vitals/Pain Pain Assessment: No/denies pain    Home Living Family/patient expects to be discharged to:: Private residence Living Arrangements: Children Available Help at Discharge: Family;Available 24 hours/day Type of Home: House Home Access: Stairs to enter Entrance Stairs-Rails: Left Entrance Stairs-Number of Steps: 4 Home Layout: One level Home Equipment: Cane - single point;Walker - 2 wheels;Bedside commode;Shower seat      Prior Function Level of Independence: Independent with assistive device(s)         Comments: pt ambulates independently in the home and utilizes a RW vs cane for community distances     Hand Dominance        Extremity/Trunk Assessment   Upper Extremity Assessment Upper Extremity Assessment: Generalized weakness    Lower Extremity Assessment Lower Extremity Assessment: Generalized weakness    Cervical / Trunk Assessment Cervical / Trunk Assessment: Kyphotic  Communication   Communication: HOH  Cognition Arousal/Alertness: Awake/alert Behavior During Therapy: WFL for tasks assessed/performed Overall  Cognitive Status: No family/caregiver present to determine baseline cognitive functioning Area of Impairment: Memory                      Memory: Decreased short-term memory         General Comments: pt often repeating questions she has recently asked PT without recollection of asking them previously      General Comments General comments (skin integrity, edema, etc.): pt initially on 1L [place] upon PT arrival, PT weans to RA with stable sats. Pt ambulates with increased work of breathing but oxygen saturation remains in high 90s on room air. Pt RR recovers quickly with seated rest break    Exercises     Assessment/Plan    PT Assessment Patient needs continued PT services  PT Problem List Decreased strength;Decreased activity tolerance;Decreased balance;Decreased mobility;Decreased cognition;Decreased knowledge of use of DME;Decreased knowledge of precautions;Cardiopulmonary status limiting activity       PT Treatment Interventions DME instruction;Gait training;Stair training;Functional mobility training;Therapeutic activities;Therapeutic exercise;Balance training;Neuromuscular re-education;Patient/family education;Cognitive remediation    PT Goals (Current goals can be found in the Care Plan section)  Acute Rehab PT Goals Patient Stated Goal: To improve mobility and activity tolerance PT Goal Formulation: With patient Time For Goal Achievement: 04/12/20 Potential to Achieve Goals: Good Additional Goals Additional Goal #1: Pt will maintain dynamic standing balance within 10 inches of her base of support with unilateral UE support of the LRAD and supervision    Frequency Min 3X/week   Barriers to discharge        Co-evaluation               AM-PAC PT "6 Clicks" Mobility  Outcome Measure Help needed turning from your back to your side while in a flat bed without using bedrails?: None Help needed moving from lying on your back to sitting on the side of a flat bed without using bedrails?: None Help needed moving to and from a bed to a chair (including a wheelchair)?: None Help needed standing up from  a chair using your arms (e.g., wheelchair or bedside chair)?: None Help needed to walk in hospital room?: A Little Help needed climbing 3-5 steps with a railing? : A Little 6 Click Score: 22    End of Session   Activity Tolerance: Patient limited by fatigue Patient left: in chair;with call bell/phone within reach;with chair alarm set Nurse Communication: Mobility status PT Visit Diagnosis: Difficulty in walking, not elsewhere classified (R26.2)    Time: [id number] PT Time Calculation (min) (ACUTE ONLY): 21 min   Charges:   PT Evaluation $PT Eval Moderate Complexity: 1 Mod          Zenaida Niece, PT, DPT Acute Rehabilitation Pager: [place] 03/29/2020, 10:00 AM

## 2020-03-30 LAB — CBC WITH DIFFERENTIAL/PLATELET
Abs Immature Granulocytes: 0.01 10*3/uL (ref 0.00–0.07)
Basophils Absolute: 0 10*3/uL (ref 0.0–0.1)
Basophils Relative: 0 %
Eosinophils Absolute: 0.2 10*3/uL (ref 0.0–0.5)
Eosinophils Relative: 7 %
HCT: 23.9 % — ABNORMAL LOW (ref 36.0–46.0)
Hemoglobin: 7.8 g/dL — ABNORMAL LOW (ref 12.0–15.0)
Immature Granulocytes: 0 %
Lymphocytes Relative: 42 %
Lymphs Abs: 1.5 10*3/uL (ref 0.7–4.0)
MCH: 28.2 pg (ref 26.0–34.0)
MCHC: 32.6 g/dL (ref 30.0–36.0)
MCV: 86.3 fL (ref 80.0–100.0)
Monocytes Absolute: 0.4 10*3/uL (ref 0.1–1.0)
Monocytes Relative: 13 %
Neutro Abs: 1.3 10*3/uL — ABNORMAL LOW (ref 1.7–7.7)
Neutrophils Relative %: 38 %
Platelets: 152 10*3/uL (ref 150–400)
RBC: 2.77 MIL/uL — ABNORMAL LOW (ref 3.87–5.11)
RDW: 13.7 % (ref 11.5–15.5)
WBC: 3.5 10*3/uL — ABNORMAL LOW (ref 4.0–10.5)
nRBC: 0 % (ref 0.0–0.2)

## 2020-03-30 LAB — GLUCOSE, CAPILLARY
Glucose-Capillary: 124 mg/dL — ABNORMAL HIGH (ref 70–99)
Glucose-Capillary: 91 mg/dL (ref 70–99)
Glucose-Capillary: 95 mg/dL (ref 70–99)

## 2020-03-30 LAB — BASIC METABOLIC PANEL
Anion gap: 9 (ref 5–15)
BUN: 5 mg/dL — ABNORMAL LOW (ref 8–23)
CO2: 25 mmol/L (ref 22–32)
Calcium: 8 mg/dL — ABNORMAL LOW (ref 8.9–10.3)
Chloride: 103 mmol/L (ref 98–111)
Creatinine, Ser: 1.53 mg/dL — ABNORMAL HIGH (ref 0.44–1.00)
GFR calc Af Amer: 35 mL/min — ABNORMAL LOW (ref >60)
GFR calc non Af Amer: 30 mL/min — ABNORMAL LOW (ref >60)
Glucose, Bld: 90 mg/dL (ref 70–99)
Potassium: 3.5 mmol/L (ref 3.5–5.1)
Sodium: 137 mmol/L (ref 135–145)

## 2020-03-30 LAB — MAGNESIUM: Magnesium: 1.6 mg/dL — ABNORMAL LOW (ref 1.7–2.4)

## 2020-03-30 LAB — SURGICAL PATHOLOGY

## 2020-03-30 MED ORDER — POTASSIUM CHLORIDE CRYS ER 20 MEQ PO TBCR
40.0000 meq | EXTENDED_RELEASE_TABLET | Freq: Once | ORAL | Status: AC
  Administered 2020-03-30: 40 meq via ORAL
  Filled 2020-03-30: qty 2

## 2020-03-30 MED ORDER — AMOXICILLIN-POT CLAVULANATE 875-125 MG PO TABS: 1.0000 | ORAL_TABLET | Freq: Two times a day (BID) | ORAL | 0 refills | Status: DC

## 2020-03-30 MED ORDER — AMOXICILLIN-POT CLAVULANATE 875-125 MG PO TABS
1.0000 | ORAL_TABLET | Freq: Two times a day (BID) | ORAL | Status: DC
  Administered 2020-03-30: 1 via ORAL
  Filled 2020-03-30: qty 1

## 2020-03-30 MED ORDER — SODIUM CHLORIDE 0.9 % IV SOLN
3.0000 g | Freq: Two times a day (BID) | INTRAVENOUS | Status: DC
  Filled 2020-03-30: qty 8

## 2020-03-30 MED ORDER — SUCRALFATE 1 G PO TABS
1.0000 g | ORAL_TABLET | Freq: Two times a day (BID) | ORAL | Status: DC
  Administered 2020-03-30: 1 g via ORAL
  Filled 2020-03-30: qty 1

## 2020-03-30 MED ORDER — LISINOPRIL 2.5 MG PO TABS: 2.5000 mg | ORAL_TABLET | Freq: Every day | ORAL | Status: DC

## 2020-03-30 MED ORDER — MAGNESIUM SULFATE 4 GM/100ML IV SOLN
4.0000 g | Freq: Once | INTRAVENOUS | Status: AC
  Administered 2020-03-30: 4 g via INTRAVENOUS
  Filled 2020-03-30: qty 100

## 2020-03-30 NOTE — Progress Notes (Signed)
Physical Therapy Treatment Patient Details Name: Jennifer Osborne MRN: [id number] DOB: 05/11/1931 Today's Date: 03/30/2020    History of Present Illness 86 y.o. female with history of A. fib who underwent recent cardioversion for weakness CAD COPD chronic kidney disease stage III anemia was admitted at [facility] on Mar 23, 2020 for hypocalcemia and hypokalemia and generalized weakness. Pt expereincing GIB in hospital resulting in transfer to [facility]. Pt underwent colonoscopy on 5/29.    PT Comments    Pt making steady progress overall with functional mobility as indicated by her ability to tolerate further gait distances. She did become a bit SOB and required standing rest breaks x2 with ambulation. Her HR fluctuated between 114 bpm and 125 bpm with activity. Pt would continue to benefit from skilled physical therapy services at this time while admitted and after d/c to address the below listed limitations in order to improve overall safety and independence with functional mobility.    Follow Up Recommendations  Home health PT;Supervision for mobility/OOB     Equipment Recommendations  None recommended by PT    Recommendations for Other Services       Precautions / Restrictions Precautions Precautions: Fall Restrictions Weight Bearing Restrictions: No    Mobility  Bed Mobility Overal bed mobility: Needs Assistance Bed Mobility: Supine to Sit     Supine to sit: Supervision        Transfers Overall transfer level: Needs assistance Equipment used: Rolling walker (2 wheeled) Transfers: Sit to/from Stand Sit to Stand: Supervision         General transfer comment: good technique utilized, steady with transition  Ambulation/Gait Ambulation/Gait assistance: Supervision Gait Distance (Feet): 100 Feet Assistive device: Rolling walker (2 wheeled) Gait Pattern/deviations: Step-through pattern Gait velocity: reduced   General Gait Details: pt with slow, steady  gait; however, with increased WOB and requiring standing rest breaks x2. Pt's HR fluctuating between 114 bpm and 125 bpm with activity   Stairs             Wheelchair Mobility    Modified Rankin (Stroke Patients Only)       Balance Overall balance assessment: Needs assistance Sitting-balance support: No upper extremity supported;Feet supported Sitting balance-Leahy Scale: Good     Standing balance support: Bilateral upper extremity supported;During functional activity Standing balance-Leahy Scale: Poor                              Cognition Arousal/Alertness: Awake/alert Behavior During Therapy: WFL for tasks assessed/performed Overall Cognitive Status: Within Functional Limits for tasks assessed                                        Exercises      General Comments        Pertinent Vitals/Pain Pain Assessment: No/denies pain    Home Living                      Prior Function            PT Goals (current goals can now be found in the care plan section) Acute Rehab PT Goals PT Goal Formulation: With patient Time For Goal Achievement: 04/12/20 Potential to Achieve Goals: Good Progress towards PT goals: Progressing toward goals    Frequency    Min 3X/week      PT  Plan Current plan remains appropriate    Co-evaluation              AM-PAC PT "6 Clicks" Mobility   Outcome Measure  Help needed turning from your back to your side while in a flat bed without using bedrails?: None Help needed moving from lying on your back to sitting on the side of a flat bed without using bedrails?: None Help needed moving to and from a bed to a chair (including a wheelchair)?: None Help needed standing up from a chair using your arms (e.g., wheelchair or bedside chair)?: None Help needed to walk in hospital room?: None Help needed climbing 3-5 steps with a railing? : A Little 6 Click Score: 23    End of Session    Activity Tolerance: Patient tolerated treatment well Patient left: in chair;with call bell/phone within reach;with chair alarm set Nurse Communication: Mobility status PT Visit Diagnosis: Difficulty in walking, not elsewhere classified (R26.2)     Time: [id number] PT Time Calculation (min) (ACUTE ONLY): 13 min  Charges:  $Gait Training: 8-22 mins                     Anastasio Champion, DPT  Acute Rehabilitation Services Pager [phone] Office [place] 03/30/2020, 12:13 PM

## 2020-03-30 NOTE — Plan of Care (Signed)
Goals met 

## 2020-03-30 NOTE — Discharge Instructions (Signed)

## 2020-03-30 NOTE — TOC Initial Note (Signed)
Transition of Care [facility]) - Initial/Assessment Note    Patient Details  Name: Jennifer Osborne MRN: [id number] Date of Birth: 01/26/1931  [facility] CM/SW Contact:    Marilu Favre, RN Phone Number: 03/30/2020, 11:07 AM  Clinical Narrative:                  Patient from home with daughter and has PACE. David with [facility] called to check on discharge needs. Have orders for Home health PT/OT, Shanon Brow asked NCM to fax to him at [phone] and he will arrange.   Shanon Brow will need DC summary faxed when complete.   Patient and daughter [name] aware. Patient already has walker, bedside commode and cane at home.   At discharge [name] is requesting patient's nurse to call her, and she will have the patient's son-in-law transport patient home. Dana aware.  Expected Discharge Plan: [place] Barriers to Discharge: Continued Medical Work up   Patient Goals and CMS Choice Patient states their goals for this hospitalization and ongoing recovery are:: to return to home CMS Medicare.gov Compare Post Acute Care list provided to:: Patient    Expected Discharge Plan and Services Expected Discharge Plan: Uncertain Choice: [place] arrangements for the past 2 months: Single Family Home                 DME Arranged: N/A DME Agency: NA       HH Arranged: PT, OT          Prior Living Arrangements/Services Living arrangements for the past 2 months: Single Family Home Lives with:: Adult Children Patient language and need for interpreter reviewed:: Yes Do you feel safe going back to the place where you live?: Yes      Need for Family Participation in Patient Care: Yes (Comment) Care giver support system in place?: Yes (comment) Current home services: DME Criminal Activity/Legal Involvement Pertinent to Current Situation/Hospitalization: No - Comment as needed  Activities of Daily Living Home Assistive  Devices/Equipment: None ADL Screening (condition at time of admission) Patient's cognitive ability adequate to safely complete daily activities?: Yes Is the patient deaf or have difficulty hearing?: No Does the patient have difficulty seeing, even when wearing glasses/contacts?: No Does the patient have difficulty concentrating, remembering, or making decisions?: No Patient able to express need for assistance with ADLs?: Yes Does the patient have difficulty dressing or bathing?: No Independently performs ADLs?: Yes (appropriate for developmental age) Does the patient have difficulty walking or climbing stairs?: No Weakness of Legs: None Weakness of Arms/Hands: None  Permission Sought/Granted   Permission granted to share information with : Yes, Verbal Permission Granted  Share Information with NAME: Hollice Gong [id number]           Emotional Assessment Appearance:: Appears stated age Attitude/Demeanor/Rapport: Engaged Affect (typically observed): Accepting Orientation: : Oriented to Self, Oriented to Place, Oriented to  Time, Oriented to Situation Alcohol / Substance Use: Not Applicable Psych Involvement: No (comment)  Admission diagnosis:  Acute GI bleeding [K92.2] Patient Active Problem List   Diagnosis Date Noted  . SOB (shortness of breath)   . Pneumonia of left upper lobe due to infectious organism   . Acute GI bleeding 03/26/2020  . Acute blood loss anemia 03/26/2020  . Essential hypertension 03/08/2020  . Chronic coronary artery disease 01/02/2020  . DNR (do not resuscitate) 01/02/2020  . Late onset  Alzheimer's disease without behavioral disturbance ([place]) 01/02/2020  . Mixed hyperlipidemia 01/02/2020  . Paroxysmal atrial fibrillation ([place]) 01/02/2020  . Peripheral neuropathy 07/15/2019  . Gait abnormality 07/15/2019  . Abnormal involuntary movement 07/15/2019  . Subarachnoid hemorrhage ([place]) 04/15/2017  . Postoperative examination 01/27/2016  . Intractable  chronic cluster headache 12/30/2015   PCP:  [name], NP Pharmacy:   [id number] - [place], [place] - [address] [facility]  [address] Phone: [phone] Fax: [phone]     Social Determinants of Health (SDOH) Interventions    Readmission Risk Interventions No flowsheet data found.

## 2020-03-30 NOTE — Discharge Summary (Signed)
Physician Discharge Summary  Jennifer Osborne [id number] DOB: [birth year] DOA: 03/26/2020  PCP: [name], NP  Admit date: 03/26/2020 Discharge date: 03/30/2020  Time spent: 55 minutes  Recommendations for Outpatient Follow-up:  1. Follow-up with Dr. Rush Landmark, gastroenterology in 2 weeks. 2. Follow-up with [name], NP in 2 weeks.  On follow-up patient will need a basic metabolic profile done to follow-up on electrolytes and renal function. 3. Follow-up with Dr. Harriet Masson, cardiology as scheduled.   Discharge Diagnoses:  Principal Problem:   Acute GI bleeding Active Problems:   Chronic coronary artery disease   Late onset Alzheimer's disease without behavioral disturbance (HCC)   Paroxysmal atrial fibrillation (HCC)   Subarachnoid hemorrhage (HCC)   Essential hypertension   Acute blood loss anemia   SOB (shortness of breath)   Pneumonia of left upper lobe due to infectious organism   Discharge Condition: Stable and improved  Diet recommendation: Heart healthy  Filed Weights   03/28/20 0418 03/29/20 0500 03/30/20 0446  Weight: 57.1 kg 58.2 kg 58.6 kg    History of present illness:  HPI per Dr. [name] is a 86 y.o. female with history of A. fib who underwent recent cardioversion for weakness CAD COPD chronic kidney disease stage III anemia was admitted at [facility] on Mar 23, 2020 for hypocalcemia and hypokalemia and generalized weakness.  Patient had potassium and calcium replaced.  Eventually patient's hemoglobin dropped from 10-7.8 and stool for occult blood was positive.  Patient was transferred to [facility] for GI bleed in the setting of the use of apixaban.  Patient states she has been noticing some black stool for the last 1 week.  Denies taking any NSAIDs.  Denies chest pain or shortness of breath.  Patient is afebrile.  Labs done on May 26 show hemoglobin of 7.8 creatinine 1.1 bicarb 24 potassium 3.7 platelets 203 WBC 4.9.  Hospital  Course:  1 acute blood loss anemia/acute GI bleed Patient presented with melanotic stools with a decreasing hemoglobin in the setting of anticoagulation for Eliquis for atrial fibrillation.  Hemoglobin on presentation was 7.8 from 10.8 on 03/08/2020. Patient s/p upper endoscopy which showed a large hiatal hernia with no signs of bleeding noted.  Patient subsequently underwent a colonoscopy with polyploid lesion found in the descending colon, circumferential with narrowing of the lumen with frond-like/villous infiltrative and polyploid oozing noted status post biopsies.  4 mm polyp also noted in the sigmoid colon which was sessile status post resection and retrieval, foreign body also noted in the mid rectum removal of a staple with regular forceps.  Nonbleeding nonthrombosed external/internal hemorrhoids also noted.  CT abdomen and pelvis ordered per GI consistent with circumferential polypoid lesion in descending colon as noted on colonoscopy concerning for mass/malignancy.  Patient resumed back on anticoagulation of heparin with no overt bleeding.  Patient subsequently placed on home regimen of Eliquis with no bleeding.  Hemoglobin remained stable at  7.8 by day of discharge.  Biopsy results done on colonoscopy came back negative for malignancy or high-grade dysplasia consistent with inflammatory polyp with ulceration and granulation tissue.  Outpatient follow-up with GI.   2.  Paroxysmal A. fib Was called by RN that patient with heart rates persistently in the 150s on 03/27/2020.  Patient noted not to have received any of her medications prior to her endoscopy.  Patient was given Lopressor 5 mg IV x1 and nurse instructed to give patient her home regimen of Cardizem and digoxin.  Rate  was controlled.  Patient subsequently placed on IV heparin 12 hours post procedure for anticoagulation.  Patient had no bleeding.  Patient subsequently transition back to home regimen Eliquis.  Outpatient follow-up with  cardiology.   3.  Shortness of breath/?  Pneumonia Patient with some complaints of shortness of breath per RN while noted with heart rates in the 150s on 03/27/2020.  Chest x-ray ordered with concerns for new patchy airspace opacities in left upper lobe, probable bibasilar atelectasis and left pleural effusion. Patient noted to have some scattered coarse breath sounds with some minimal expiratory wheezing on 03/29/2020.  Patient noted to be +2.58 L during this hospitalization.  Urine Legionella antigen and urine pneumococcus antigen were ordered.  Patient placed empirically on IV Unasyn.  Patient also placed on Lasix 20 mg IV every 12 hours x2 doses with a urine output of  3.250 L.  Patient improved clinically such that by day of discharge patient had no further wheezing with sats of 99% on room air.  Patient was transitioned to Augmentin will be discharged home on 5 more days of Augmentin to complete a 7-day course of antibiotic treatment.  Outpatient follow-up with PCP.    4.  Abnormal colonoscopy Polypoid lesion noted on descending colon, circumferential with narrowing of the lumen, frond-like/villous infiltrative and polypoid losing noted on colonoscopy status post biopsies.   CT abdomen and pelvis ordered per GI which showed a circumferential wall thickening of the colon at the splenic flexure measuring at least 10 cm in length likely representing mass/malignancy identified on colonoscopy.  8 mm lymph node between the colon and liver not present on 2017 abdominal CT suspicious for lymph node mets.  No other evidence of metastatic disease within the abdomen or pelvis.  Small bilateral pleural effusions and mild bibasilar atelectasis.  Very large hiatal hernia.. Biopsy results which were done came back negative for malignancy or high-grade dysplasia consistent with inflammatory polyp with ulceration and granulation tissue.  Outpatient follow-up with GI.   5.  Prior history of subarachnoid  hemorrhage Stable.  6.  Coronary artery disease Patient maintained on home regimen of digoxin, Cardizem, Aldactone.  Lisinopril was held and will be resumed 3 to 4 days post discharge.  Outpatient follow-up.    7.  Hypothyroidism Patient remains been on home regimen of Synthroid.   8.  Chronic kidney disease stage III Remained stable.  Outpatient follow-up with PCP.    9.  Dementia Stable.  Procedures:  CT abdomen and pelvis 03/27/2020  Chest x-ray 03/27/2020  Consultations:  Gastroenterology: Dr. Rush Landmark  Discharge Exam: Vitals:   03/30/20 0503 03/30/20 0918  BP: (!) 124/56   Pulse: (!) 58 97  Resp: 16 16  Temp: 99.5 F (37.5 C)   SpO2: 97% 99%    General: NAD Cardiovascular: Irregularly irregular Respiratory: Clear to auscultation bilaterally.  No wheezes, no crackles, no rhonchi.  Discharge Instructions   Discharge Instructions    Diet - low sodium heart healthy   Complete by: As directed    Increase activity slowly   Complete by: As directed      Allergies as of 03/30/2020      Reactions   Morphine And Related Itching   Pt states she had itching, no rash   Meperidine Nausea Only   Pregabalin Other (See Comments)   Caused stroke      Medication List    TAKE these medications   albuterol 108 (90 Base) MCG/ACT inhaler Commonly known as: VENTOLIN HFA Inhale  1 puff into the lungs every 4 (four) hours as needed for wheezing or shortness of breath.   ALIVE MULTI-VITAMIN PO Take 1 tablet by mouth daily.   amoxicillin-clavulanate 875-125 MG tablet Commonly known as: AUGMENTIN Take 1 tablet by mouth every 12 (twelve) hours for 5 days.   apixaban 2.5 MG Tabs tablet Commonly known as: ELIQUIS Take 1 tablet (2.5 mg total) by mouth 2 (two) times daily.   Calcium Extra D3 500-600 MG-UNIT Tabs Generic drug: Calcium Carb-Cholecalciferol Take 1 tablet by mouth 2 (two) times daily.   digoxin 0.125 MG tablet Commonly known as: LANOXIN Take 1  tablet (0.125 mg total) by mouth daily.   diltiazem 120 MG 24 hr capsule Commonly known as: DILACOR XR Take 120 mg by mouth in the morning and at bedtime.   DULoxetine 30 MG capsule Commonly known as: CYMBALTA Take 30 mg by mouth See admin instructions. Take along with 60mg  for a total daily dose of 90mg  daily. Take on Tuesday, Thursday, Saturday, and Sunday.   DULoxetine 60 MG capsule Commonly known as: CYMBALTA Take 60 mg by mouth See admin instructions. Take along with 30mg  for a total daily dose of 90mg  daily. Take on Tuesday, Thursday, Saturday, and Sunday.   levothyroxine 25 MCG tablet Commonly known as: SYNTHROID Take 25 mcg by mouth daily before breakfast.   lisinopril 2.5 MG tablet Commonly known as: ZESTRIL Take 1 tablet (2.5 mg total) by mouth daily. Start taking on: April 03, 2020 What changed: These instructions start on April 03, 2020. If you are unsure what to do until then, ask your doctor or other care provider.   mirtazapine 7.5 MG tablet Commonly known as: REMERON Take 7.5 mg by mouth at bedtime.   ondansetron 4 MG tablet Commonly known as: ZOFRAN Take 4 mg by mouth every 8 (eight) hours as needed for nausea or vomiting.   pantoprazole 40 MG tablet Commonly known as: PROTONIX Take 40 mg by mouth daily.   rosuvastatin 20 MG tablet Commonly known as: CRESTOR Take 20 mg by mouth daily.   spironolactone 25 MG tablet Commonly known as: ALDACTONE Take 25 mg by mouth daily.   sucralfate 1 g tablet Commonly known as: CARAFATE Take 1 g by mouth 2 (two) times daily before a meal.   tetrabenazine 12.5 MG tablet Commonly known as: XENAZINE Take 12.5 mg by mouth daily.   Trelegy Ellipta 100-62.5-25 MCG/INH Aepb Generic drug: Fluticasone-Umeclidin-Vilant Inhale 1 puff into the lungs daily.      Allergies  Allergen Reactions  . Morphine And Related Itching    Pt states she had itching, no rash  . Meperidine Nausea Only  . Pregabalin Other (See Comments)     Caused stroke   Follow-up Information    [name], NP. Schedule an appointment as soon as possible for a visit in 2 week(s).   Specialty: Adult Health Nurse Practitioner Contact information: [address] [place] [id number] [phone]        Berniece Salines, DO .   Specialty: Cardiology Contact information: [place] [address] [phone]        Mansouraty, Telford Nab., MD. Schedule an appointment as soon as possible for a visit in 1 week(s).   Specialties: Gastroenterology, Internal Medicine Why: f/u in 1-2 weeks Contact information: [place] [address] [phone]            The results of significant diagnostics from this hospitalization (including imaging, microbiology, ancillary  and laboratory) are listed below for reference.    Significant Diagnostic Studies: CT ABDOMEN PELVIS W CONTRAST  Result Date: 03/27/2020 CLINICAL DATA:  86 year old female with GI bleeding and descending colonic mass noted on colonoscopy today. EXAM: CT ABDOMEN AND PELVIS WITH CONTRAST TECHNIQUE: Multidetector CT imaging of the abdomen and pelvis was performed using the standard protocol following bolus administration of intravenous contrast. CONTRAST:  10mL OMNIPAQUE IOHEXOL 300 MG/ML  SOLN COMPARISON:  10/19/2016 abdominal/pelvic CT, 12/12/2019 chest CT and other studies. FINDINGS: Lower chest: Small bilateral pleural effusions and mild bibasilar atelectasis are new since 12/12/2019. A very large hiatal hernia with majority of the stomach noted intrathoracic. Hepatobiliary: No significant hepatic or gallbladder abnormalities are noted. No focal hepatic lesions are identified. No biliary dilatation. Pancreas: Unremarkable Spleen: Unremarkable Adrenals/Urinary Tract: The kidneys, adrenal glands and bladder are unremarkable except for bilateral renal cysts. Stomach/Bowel: There is slightly irregular circumferential wall thickening of the colon at the  splenic flexure measuring at least 10 cm in length. This likely represents the mass identified on colonoscopy. There is an 8 mm lymph node between the colon and liver (series 3: Image 16) which is indeterminate but not definitely present on prior abdominal CTs. No other definite areas of bowel wall thickening are identified. There is no evidence of bowel obstruction or pneumoperitoneum. Vascular/Lymphatic: Aortic atherosclerotic calcifications are identified. There is no evidence of abdominal aortic aneurysm. Aorto bi-iliac stents are present. No other abnormal lymph nodes are identified. Reproductive: Status post hysterectomy. No adnexal masses. Other: No ascites or peritoneal/omental abnormality. Musculoskeletal: 6 no acute or suspicious bony abnormalities. IMPRESSION: 1. Circumferential wall thickening of the colon at the splenic flexure measuring at least 10 cm in length, likely representing the mass/malignancy identified on colonoscopy. An 8 mm lymph node between the colon and liver, not definitely present on the 2017 abdominal CT and suspicious for a lymph node metastasis. 2. No other evidence of metastatic disease within the abdomen or pelvis. 3. Small bilateral pleural effusions and mild bibasilar atelectasis. 4. Very large hiatal hernia. 5. Aortic Atherosclerosis (ICD10-I70.0). Electronically Signed   By: Margarette Canada M.D.   On: 03/27/2020 15:42   DG CHEST PORT 1 VIEW  Result Date: 03/27/2020 CLINICAL DATA:  SOB EXAM: PORTABLE CHEST 1 VIEW COMPARISON:  Chest radiograph 12/12/2019 FINDINGS: Stable cardiomediastinal contours. Aortic arch calcification. There are new patchy airspace opacities in the left upper lung. There are hazy opacities at the left lung base which may reflect a combination of atelectasis and pleural fluid. There is a bandlike opacity in the medial right lung base favored to represent atelectasis. No pneumothorax. Possible small volume left pleural fluid. No acute finding in the  visualized skeleton. IMPRESSION: 1. New patchy airspace opacities in the left upper lung raising concern for infection. 2. Probable bibasilar atelectasis and left pleural effusion. Radiographic follow-up is recommended in 3-4 weeks to ensure resolution. Electronically Signed   By: Audie Pinto M.D.   On: 03/27/2020 16:57    Microbiology: Recent Results (from the past 240 hour(s))  SARS Coronavirus 2 by RT PCR (hospital order, performed in [facility] hospital lab) Nasopharyngeal Nasopharyngeal Swab     Status: None   Collection Time: 03/26/20  6:23 AM   Specimen: Nasopharyngeal Swab  Result Value Ref Range Status   SARS Coronavirus 2 NEGATIVE NEGATIVE Final    Comment: (NOTE) SARS-CoV-2 target nucleic acids are NOT DETECTED. The SARS-CoV-2 RNA is generally detectable in upper and lower respiratory specimens during the acute phase of  infection. The lowest concentration of SARS-CoV-2 viral copies this assay can detect is 250 copies / mL. A negative result does not preclude SARS-CoV-2 infection and should not be used as the sole basis for treatment or other patient management decisions.  A negative result may occur with improper specimen collection / handling, submission of specimen other than nasopharyngeal swab, presence of viral mutation(s) within the areas targeted by this assay, and inadequate number of viral copies (<250 copies / mL). A negative result must be combined with clinical observations, patient history, and epidemiological information. Fact Sheet for Patients:   StrictlyIdeas.no Fact Sheet for Healthcare Providers: BankingDealers.co.za This test is not yet approved or cleared  by the [place] FDA and has been authorized for detection and/or diagnosis of SARS-CoV-2 by FDA under an Emergency Use Authorization (EUA).  This EUA will remain in effect (meaning this test can be used) for the duration of the COVID-19  declaration under Section 564(b)(1) of the Act, 21 U.S.C. section 360bbb-3(b)(1), unless the authorization is terminated or revoked sooner. Performed at [facility], [place] [address], [place], [place] [address]      Labs: Basic Metabolic Panel: Recent Labs  Lab 03/26/20 0533 03/27/20 0405 03/28/20 0827 03/29/20 0208 03/30/20 0226  NA 137 137 137 137 137  K 3.8 4.4 3.5 3.7 3.5  CL 107 109 106 107 103  CO2 24 22 21* 21* 25  GLUCOSE 90 99 91 88 90  BUN 8 6* <5* <5* 5*  CREATININE 1.11* 1.11* 1.18* 1.20* 1.53*  CALCIUM 8.1* 8.0* 7.9* 7.8* 8.0*  MG 1.6* 2.1 1.8 1.9 1.6*   Liver Function Tests: Recent Labs  Lab 03/26/20 0533  AST 20  ALT 15  ALKPHOS 63  BILITOT 0.4  PROT 4.6*  ALBUMIN 2.2*   No results for input(s): LIPASE, AMYLASE in the last 168 hours. No results for input(s): AMMONIA in the last 168 hours. CBC: Recent Labs  Lab 03/26/20 0813 03/26/20 0813 03/26/20 1629 03/27/20 0405 03/28/20 0827 03/29/20 0208 03/30/20 0226  WBC 4.6  --   --  4.9 5.0 3.6* 3.5*  NEUTROABS  --   --   --   --  2.5  --  1.3*  HGB 7.6*   < > 8.8* 8.3* 8.4* 7.6* 7.8*  HCT 24.2*   < > 27.7* 25.9* 26.1* 23.2* 23.9*  MCV 90.0  --   --  89.3 86.7 86.9 86.3  PLT 150  --   --  168 156 149* 152   < > = values in this interval not displayed.   Cardiac Enzymes: No results for input(s): CKTOTAL, CKMB, CKMBINDEX, TROPONINI in the last 168 hours. BNP: BNP (last 3 results) No results for input(s): BNP in the last 8760 hours.  ProBNP (last 3 results) No results for input(s): PROBNP in the last 8760 hours.  CBG: Recent Labs  Lab 03/29/20 0738 03/29/20 1610 03/30/20 0012 03/30/20 0739 03/30/20 1234  GLUCAP 97 94 91 95 124*       Signed:  Irine Seal MD.  [facility] 03/30/2020, 2:53 PM

## 2020-04-01 ENCOUNTER — Encounter: Payer: Self-pay | Admitting: Cardiology

## 2020-04-01 ENCOUNTER — Other Ambulatory Visit: Payer: Self-pay

## 2020-04-01 ENCOUNTER — Other Ambulatory Visit: Payer: Self-pay | Admitting: Cardiology

## 2020-04-01 ENCOUNTER — Ambulatory Visit (INDEPENDENT_AMBULATORY_CARE_PROVIDER_SITE_OTHER): Payer: No Typology Code available for payment source | Admitting: Cardiology

## 2020-04-01 VITALS — BP 100/62 | HR 61 | Ht 64.0 in | Wt 120.0 lb

## 2020-04-01 DIAGNOSIS — R0600 Dyspnea, unspecified: Secondary | ICD-10-CM | POA: Diagnosis not present

## 2020-04-01 DIAGNOSIS — I4891 Unspecified atrial fibrillation: Secondary | ICD-10-CM | POA: Diagnosis not present

## 2020-04-01 DIAGNOSIS — Z79899 Other long term (current) drug therapy: Secondary | ICD-10-CM

## 2020-04-01 DIAGNOSIS — I10 Essential (primary) hypertension: Secondary | ICD-10-CM

## 2020-04-01 DIAGNOSIS — R0609 Other forms of dyspnea: Secondary | ICD-10-CM

## 2020-04-01 DIAGNOSIS — R5383 Other fatigue: Secondary | ICD-10-CM

## 2020-04-01 NOTE — Patient Instructions (Signed)
Medication Instructions:  Your physician recommends that you continue on your current medications as directed. Please refer to the Current Medication list given to you today.  *If you need a refill on your cardiac medications before your next appointment, please call your pharmacy*   Lab Work: Your physician recommends that you return for lab work in: TODAY CBC, BMP, Mag, Digoxin level If you have labs (blood work) drawn today and your tests are completely normal, you will receive your results only by:  MyChart Message (if you have MyChart) OR  A paper copy in the mail If you have any lab test that is abnormal or we need to change your treatment, we will call you to review the results.   Testing/Procedures: None   Follow-Up: At [facility], you and your health needs are our priority.  As part of our continuing mission to provide you with exceptional heart care, we have created designated Provider Care Teams.  These Care Teams include your primary Cardiologist (physician) and Advanced Practice Providers (APPs -  Physician Assistants and Nurse Practitioners) who all work together to provide you with the care you need, when you need it.  We recommend signing up for the patient portal called "MyChart".  Sign up information is provided on this After Visit Summary.  MyChart is used to connect with patients for Virtual Visits (Telemedicine).  Patients are able to view lab/test results, encounter notes, upcoming appointments, etc.  Non-urgent messages can be sent to your provider as well.   To learn more about what you can do with MyChart, go to [email or web address].    Your next appointment:   1 month(s)  The format for your next appointment:   In Person  Provider:   Berniece Salines, DO   Other Instructions We have put in a referral for you to see a cardiac electrophysiology Dr. in [place]. His name is Dr. Lovena Le. You will receive a call to schedule this appointment.

## 2020-04-01 NOTE — Progress Notes (Signed)
Cardiology Office Note:    Date:  04/01/2020   ID:  Jennifer Osborne, DOB [birth year], MRN [id number]  PCP:  [name], NP  Cardiologist:  Berniece Salines, DO  Electrophysiologist:  None   Referring MD: [name], NP   Chief Complaint  Patient presents with  . Follow-up   History of Present Illness:    LAMOINE PIRILLO a 86 y.o.femalewith a hx of atrial fibrillation, coronary artery disease,diastolic heart failure ejection fraction back in 2018 55 to [id number], diastolic dysfunction, hyperlipidemia, hypertension, history of subarachnoid hemorrhage history of subarachnoid hemorrhage in June 2018 per records the patient did have a CT head at [facility] which is concerning for small right temporal subarachnoid hemorrhage she was therefore transferred to [facility] on April 15, 2017. Underwent CT of the head which did not show any subarachnoid hemorrhage. She was seen by neurosurgery discharged home -her Eliquis was also continued.  I did see the patient on February 09, 2020 at that time she was presenting due to atrial fibrillation with rapid ventricular rate.  Started patient on digoxin due to hypotension and fast heart rate.  Also the patient on Mar 08, 2020 at that time we discussed her echocardiogram which show EF of 60 to 65%, severely dilated left atrium, mild annular calcification with mild regurgitation and mild pulmonary hypertension.  In the interim the patient underwent a cardioversion, unfortunately she was not able to stay in sinus rhythm.  Subsequently she was admitted to [facility] and then transferred to [facility] for for GI bleeding.  She did undergo endoscopy which reported no gross lesions were noted in the entire esophagus, stomach and duodenal bulb.  Also underwent a colonoscopy with did not show any active bleeding.  She was placed back on anticoagulation discharged home.   The patient is here today for follow-up visit she is  here with her granddaughter care.  She tells me that she still is experiencing significant fatigue and shortness of breath on exertion.  She denies any chest pain.   Past Medical History:  Diagnosis Date  . Abnormal involuntary movement 07/15/2019  . Abnormal weight loss   . Acute blood loss anemia 03/26/2020  . Acute GI bleeding 03/26/2020  . Ambulatory dysfunction   . Asthma   . Atrial fibrillation ([place])   . Basilar artery stenosis   . Bile reflux gastritis   . BMI 21.0-21.9, adult   . Brain bleed ([place])    history in 2019  . Cerumen impaction   . Chronic constipation   . Chronic coronary artery disease 01/02/2020  . Chronic nausea   . COPD (chronic obstructive pulmonary disease) ([place])   . Degenerative disc disease, lumbar   . Depression with anxiety   . Diastolic CHF ([place])   . DNR (do not resuscitate) 01/02/2020  . Dysphagia   . Essential hypertension 03/08/2020  . External hemorrhoids   . Frequent falls   . Gait abnormality 07/15/2019  . Generalized anxiety disorder   . GERD (gastroesophageal reflux disease)   . Hiatal hernia   . Hyperlipemia   . Hyperlipemia   . Hypothyroidism   . Insomnia   . Internal hemorrhoids   . Intractable chronic cluster headache 12/30/2015  . Iron deficiency anemia   . Late onset Alzheimer's disease without behavioral disturbance ([place]) 01/02/2020  . Major depression   . Migraine   . Mixed hyperlipidemia 01/02/2020  . Neurological disorder    H[place]'s Similar  .  Osteoarthritis, generalized   . PAD (peripheral artery disease) ([place])   . Paroxysmal atrial fibrillation (HCC)   . Pneumonia of left upper lobe due to infectious organism   . Postoperative examination 01/27/2016  . PVD (peripheral vascular disease) ([place])   . Radiculopathy    left lower leg  . Sigmoid diverticulosis   . SOB (shortness of breath)   . [place] dance   . Subarachnoid hemorrhage ([place]) 04/15/2017  . Systemic lupus erythematosus (SLE) in remission ([place])   . Urinary  incontinence   . Urinary tract infection   . Vascular dementia without behavioral disturbance ([place])   . Vitamin B 12 deficiency   . Vitamin D deficiency   . Weakness     Past Surgical History:  Procedure Laterality Date  . ABDOMINAL HYSTERECTOMY    . APPENDECTOMY    . BACK SURGERY    . BIOPSY  03/27/2020   Procedure: BIOPSY;  Surgeon: Rush Landmark Telford Nab., MD;  Location: [place];  Service: Gastroenterology;;  . COLONOSCOPY WITH PROPOFOL N/A 03/27/2020   Procedure: COLONOSCOPY WITH PROPOFOL;  Surgeon: Irving Copas., MD;  Location: [place];  Service: Gastroenterology;  Laterality: N/A;  . ESOPHAGOGASTRODUODENOSCOPY (EGD) WITH PROPOFOL N/A 03/27/2020   Procedure: ESOPHAGOGASTRODUODENOSCOPY (EGD) WITH PROPOFOL;  Surgeon: Rush Landmark Telford Nab., MD;  Location: [place];  Service: Gastroenterology;  Laterality: N/A;  . FOREIGN BODY REMOVAL  03/27/2020   Procedure: FOREIGN BODY REMOVAL;  Surgeon: Rush Landmark Telford Nab., MD;  Location: [place];  Service: Gastroenterology;;  . Everlean Alstrom ANGIOGRAM EXTREMITY BILATERAL  01/15/2019  . IR AORTAGRAM ABDOMINAL SERIALOGRAM  01/15/2019  . IR ILIAC ART STENT INC PTA MOD SED  01/15/2019  . IR TRANSCATH PLC STENT 1ST ART NOT LE CV CAR VERT CAR  01/15/2019  . IR US GUIDE VASC ACCESS LEFT  01/15/2019  . IR US GUIDE VASC ACCESS RIGHT  01/15/2019  . POLYPECTOMY  03/27/2020   Procedure: POLYPECTOMY;  Surgeon: Mansouraty, Telford Nab., MD;  Location: [place];  Service: Gastroenterology;;  . Lia Foyer TATTOO INJECTION  03/27/2020   Procedure: SUBMUCOSAL TATTOO INJECTION;  Surgeon: Irving Copas., MD;  Location: [place];  Service: Gastroenterology;;    Current Medications: Current Meds  Medication Sig  . albuterol (VENTOLIN HFA) 108 (90 Base) MCG/ACT inhaler Inhale 1 puff into the lungs every 4 (four) hours as needed for wheezing or shortness of breath.   Marland Kitchen amoxicillin-clavulanate (AUGMENTIN) 875-125 MG tablet Take 1 tablet  by mouth 2 (two) times daily.  Marland Kitchen apixaban (ELIQUIS) 2.5 MG TABS tablet Take 1 tablet (2.5 mg total) by mouth 2 (two) times daily.  . Calcium Carb-Cholecalciferol (CALCIUM EXTRA D3) 500-600 MG-UNIT TABS Take 1 tablet by mouth 2 (two) times daily.  . digoxin (LANOXIN) 0.125 MG tablet Take 1 tablet (0.125 mg total) by mouth daily.  Marland Kitchen diltiazem (DILACOR XR) 120 MG 24 hr capsule Take 120 mg by mouth in the morning and at bedtime.  . DULoxetine (CYMBALTA) 30 MG capsule Take 30 mg by mouth See admin instructions. Take along with 60mg  for a total daily dose of 90mg  daily. Take on Tuesday, Thursday, Saturday, and Sunday.  . DULoxetine (CYMBALTA) 60 MG capsule Take 60 mg by mouth See admin instructions. Take along with 30mg  for a total daily dose of 90mg  daily. Take on Tuesday, Thursday, Saturday, and Sunday.  . Fluticasone-Umeclidin-Vilant (TRELEGY ELLIPTA) 100-62.5-25 MCG/INH AEPB Inhale 1 puff into the lungs daily.  Marland Kitchen levothyroxine (SYNTHROID) 25 MCG tablet Take 25 mcg by mouth daily before  breakfast.   . [START ON 04/03/2020] lisinopril (ZESTRIL) 2.5 MG tablet Take 1 tablet (2.5 mg total) by mouth daily.  . mirtazapine (REMERON) 7.5 MG tablet Take 7.5 mg by mouth at bedtime.  . Multiple Vitamins-Minerals (ALIVE MULTI-VITAMIN PO) Take 1 tablet by mouth daily.   . ondansetron (ZOFRAN) 4 MG tablet Take 4 mg by mouth every 8 (eight) hours as needed for nausea or vomiting.  . pantoprazole (PROTONIX) 40 MG tablet Take 40 mg by mouth daily.   . rosuvastatin (CRESTOR) 20 MG tablet Take 20 mg by mouth daily.  Marland Kitchen spironolactone (ALDACTONE) 25 MG tablet Take 25 mg by mouth daily.  . sucralfate (CARAFATE) 1 g tablet Take 1 g by mouth 2 (two) times daily before a meal.  . tetrabenazine (XENAZINE) 12.5 MG tablet Take 12.5 mg by mouth daily.  . [DISCONTINUED] amoxicillin-clavulanate (AUGMENTIN) 875-125 MG tablet Take 1 tablet by mouth every 12 (twelve) hours for 5 days.     Allergies:   Morphine and related,  Meperidine, and Pregabalin   Social History   Socioeconomic History  . Marital status: Married    Spouse name: Not on file  . Number of children: 5  . Years of education: 60  . Highest education level: High school graduate  Occupational History  . Occupation: Retired  Tobacco Use  . Smoking status: Never Smoker  . Smokeless tobacco: Never Used  Substance and Sexual Activity  . Alcohol use: Never  . Drug use: Never  . Sexual activity: Not on file  Other Topics Concern  . Not on file  Social History Narrative   Lives at home with daughter.   Right-handed.   3-4 cups caffeine per day (Coke).   Social Determinants of Health   Financial Resource Strain:   . Difficulty of Paying Living Expenses:   Food Insecurity:   . Worried About Charity fundraiser in the Last Year:   . Arboriculturist in the Last Year:   Transportation Needs:   . Film/video editor (Medical):   Marland Kitchen Lack of Transportation (Non-Medical):   Physical Activity:   . Days of Exercise per Week:   . Minutes of Exercise per Session:   Stress:   . Feeling of Stress :   Social Connections:   . Frequency of Communication with Friends and Family:   . Frequency of Social Gatherings with Friends and Family:   . Attends Religious Services:   . Active Member of Clubs or Organizations:   . Attends Archivist Meetings:   Marland Kitchen Marital Status:      Family History: The patient's family history includes Heart attack in her father; Heart disease in her mother.  ROS:   Review of Systems  Constitution: Negative for decreased appetite, fever and weight gain.  HENT: Negative for congestion, ear discharge, hoarse voice and sore throat.   Eyes: Negative for discharge, redness, vision loss in right eye and visual halos.  Cardiovascular: Negative for chest pain, dyspnea on exertion, leg swelling, orthopnea and palpitations.  Respiratory: Negative for cough, hemoptysis, shortness of breath and snoring.   Endocrine:  Negative for heat intolerance and polyphagia.  Hematologic/Lymphatic: Negative for bleeding problem. Does not bruise/bleed easily.  Skin: Negative for flushing, nail changes, rash and suspicious lesions.  Musculoskeletal: Negative for arthritis, joint pain, muscle cramps, myalgias, neck pain and stiffness.  Gastrointestinal: Negative for abdominal pain, bowel incontinence, diarrhea and excessive appetite.  Genitourinary: Negative for decreased libido, genital sores and incomplete emptying.  Neurological: Negative for brief paralysis, focal weakness, headaches and loss of balance.  Psychiatric/Behavioral: Negative for altered mental status, depression and suicidal ideas.  Allergic/Immunologic: Negative for HIV exposure and persistent infections.    EKGs/Labs/Other Studies Reviewed:    The following studies were reviewed today:   EKG: Atrial fibrillation with controlled ventricular rate occasional PVCs with heart rate 78 bpm.   Upper endoscopy Mar 27, 2020 Findings: A very large intrathoracic hiatal hernia was present. No gross mucosal lesions were noted in the entire examined stomach. However, overall ability to visualize the entire stomach was decreased as a result of the hernia sac. No gross lesions were noted in the duodenal bulb, in the first portion of the duodenum and in the second portion of the duodenum. - No gross lesions in esophagus. - Large hiatal hernia. - No gross lesions in the stomach. - No gross lesions in the duodenal bulb, in the first portion of the duodenum and in the second portion of the duodenum.   Colonoscopy Mar 27, 2020 Hemorrhoids and sskin tags found on digital rectal exam. - Tortuous colon. - Rule out malignancy, polypoid lesion in the descending colon. Biopsied. Tattooed distally to the lesion. - One 4 mm polyp in the sigmoid colon, removed with a cold snare. Resected and retrieved. - Foreign body - staple in the mid rectum. Removal was  successful. - What appeared to be a possible colo-colo anastomosis (end-to-end) was present just proximal to the staple. - Non-bleeding non-thrombosed external and internal hemorrhoids.  Recent Labs:  03/26/2020: ALT 15; TSH 0.916 03/30/2020: BUN 5; Creatinine, Ser 1.53; Hemoglobin 7.8; Magnesium 1.6; Platelets 152; Potassium 3.5; Sodium 137  Recent Lipid Panel No results found for: CHOL, TRIG, HDL, CHOLHDL, VLDL, LDLCALC, LDLDIRECT  Physical Exam:    VS:  BP 100/62 (BP Location: Right Arm, Patient Position: Sitting, Cuff Size: Normal)   Pulse 61   Ht 5\' 4"  (1.626 m)   Wt 120 lb (54.4 kg)   SpO2 98%   BMI 20.60 kg/m     Wt Readings from Last 3 Encounters:  04/01/20 120 lb (54.4 kg)  03/30/20 129 lb 3 oz (58.6 kg)  03/08/20 128 lb (58.1 kg)    GEN: Well nourished, well developed in no acute distress HEENT: Normal NECK: No JVD; No carotid bruits LYMPHATICS: No lymphadenopathy CARDIAC: S1S2 noted,RRR, no murmurs, rubs, gallops RESPIRATORY:  Clear to auscultation without rales, wheezing or rhonchi  ABDOMEN: Soft, non-tender, non-distended, +bowel sounds, no guarding. EXTREMITIES: No edema, No cyanosis, no clubbing MUSCULOSKELETAL:  No deformity  SKIN: Warm and dry NEUROLOGIC:  Alert and oriented x 3, non-focal PSYCHIATRIC:  Normal affect, good insight  ASSESSMENT:    1. Atrial fibrillation, unspecified type ([place])   2. Dyspnea on exertion   3. Fatigue, unspecified type   4. High risk medication use   5. Essential hypertension    PLAN:    She is still symptomatic with atrial fibrillation.  She failed cardioversion and now is back in A. fib.  Based on her symptoms we discussed options and her granddaughter was present at the visit today.  I like to send the patient to be evaluated by EP as she may be a good candidate for AV node ablation and pacemaker implantation.  We will hold off on any adding antiarrhythmics at this time.  Continue patient on Cardizem and digoxin.  We will  get the digoxin level today.  She was restarted on her Eliquis after her endoscopy and colonoscopy.  She  is on Eliquis 2.5 mg twice daily.  I am going to repeat her CBC.  I spoke with the patient and her granddaughter that if she has recurrent GI bleeding she may be referred to be evaluated for watchman device.  For now, we will continue to monitor she will see EP as stated above.  Hypertension-blood pressure is acceptable in the office today.  Blood work will be done today which would include digoxin level, CBC, mag.  The patient is in agreement with the above plan. The patient left the office in stable condition.  The patient will follow up in 1 month or sooner if needed.   Medication Adjustments/Labs and Tests Ordered: Current medicines are reviewed at length with the patient today.  Concerns regarding medicines are outlined above.  Orders Placed This Encounter  Procedures  . Digoxin level  . Basic Metabolic Panel (BMET)  . CBC  . Magnesium  . Ambulatory referral to Cardiac Electrophysiology  . EKG 12-Lead   No orders of the defined types were placed in this encounter.   Patient Instructions  Medication Instructions:  Your physician recommends that you continue on your current medications as directed. Please refer to the Current Medication list given to you today.  *If you need a refill on your cardiac medications before your next appointment, please call your pharmacy*   Lab Work: Your physician recommends that you return for lab work in: TODAY CBC, BMP, Mag, Digoxin level If you have labs (blood work) drawn today and your tests are completely normal, you will receive your results only by: Marland Kitchen MyChart Message (if you have MyChart) OR . A paper copy in the mail If you have any lab test that is abnormal or we need to change your treatment, we will call you to review the results.   Testing/Procedures: None   Follow-Up: At [facility], you and your health needs are our  priority.  As part of our continuing mission to provide you with exceptional heart care, we have created designated Provider Care Teams.  These Care Teams include your primary Cardiologist (physician) and Advanced Practice Providers (APPs -  Physician Assistants and Nurse Practitioners) who all work together to provide you with the care you need, when you need it.  We recommend signing up for the patient portal called "MyChart".  Sign up information is provided on this After Visit Summary.  MyChart is used to connect with patients for Virtual Visits (Telemedicine).  Patients are able to view lab/test results, encounter notes, upcoming appointments, etc.  Non-urgent messages can be sent to your provider as well.   To learn more about what you can do with MyChart, go to [email or web address].    Your next appointment:   1 month(s)  The format for your next appointment:   In Person  Provider:   Berniece Salines, DO   Other Instructions We have put in a referral for you to see a cardiac electrophysiology Dr. in [place]. His name is Dr. Lovena Le. You will receive a call to schedule this appointment.      Adopting a Healthy Lifestyle.  Know what a healthy weight is for you (roughly BMI <25) and aim to maintain this   Aim for 7+ servings of fruits and vegetables daily   65-80+ fluid ounces of water or unsweet tea for healthy kidneys   Limit to max 1 drink of alcohol per day; avoid smoking/tobacco   Limit animal fats in diet for cholesterol and heart health - choose grass  fed whenever available   Avoid highly processed foods, and foods high in saturated/trans fats   Aim for low stress - take time to unwind and care for your mental health   Aim for 150 min of moderate intensity exercise weekly for heart health, and weights twice weekly for bone health   Aim for 7-9 hours of sleep daily   When it comes to diets, agreement about the perfect plan isnt easy to find, even among the  experts. Experts at the [employer] developed an idea known as the Healthy Eating Plate. Just imagine a plate divided into logical, healthy portions.   The emphasis is on diet quality:   Load up on vegetables and fruits - one-half of your plate: Aim for color and variety, and remember that potatoes dont count.   Go for whole grains - one-quarter of your plate: Whole wheat, barley, wheat berries, quinoa, oats, brown rice, and foods made with them. If you want pasta, go with whole wheat pasta.   Protein power - one-quarter of your plate: Fish, chicken, beans, and nuts are all healthy, versatile protein sources. Limit red meat.   The diet, however, does go beyond the plate, offering a few other suggestions.   Use healthy plant oils, such as olive, canola, soy, corn, sunflower and peanut. Check the labels, and avoid partially hydrogenated oil, which have unhealthy trans fats.   If youre thirsty, drink water. Coffee and tea are good in moderation, but skip sugary drinks and limit milk and dairy products to one or two daily servings.   The type of carbohydrate in the diet is more important than the amount. Some sources of carbohydrates, such as vegetables, fruits, whole grains, and beans-are healthier than others.   Finally, stay active  Signed, Berniece Salines, DO  04/01/2020 3:07 PM    [facility]

## 2020-04-02 ENCOUNTER — Telehealth: Payer: Self-pay

## 2020-04-02 ENCOUNTER — Encounter: Payer: Self-pay | Admitting: Gastroenterology

## 2020-04-02 LAB — CBC
Hematocrit: 34.1 % (ref 34.0–46.6)
Hemoglobin: 11.1 g/dL (ref 11.1–15.9)
MCH: 28.1 pg (ref 26.6–33.0)
MCHC: 32.6 g/dL (ref 31.5–35.7)
MCV: 86 fL (ref 79–97)
Platelets: 244 10*3/uL (ref 150–450)
RBC: 3.95 x10E6/uL (ref 3.77–5.28)
RDW: 13 % (ref 11.7–15.4)
WBC: 7.2 10*3/uL (ref 3.4–10.8)

## 2020-04-02 LAB — MAGNESIUM: Magnesium: 1.9 mg/dL (ref 1.6–2.3)

## 2020-04-02 LAB — BASIC METABOLIC PANEL
BUN/Creatinine Ratio: 4 — ABNORMAL LOW (ref 12–28)
BUN: 6 mg/dL — ABNORMAL LOW (ref 8–27)
CO2: 19 mmol/L — ABNORMAL LOW (ref 20–29)
Calcium: 8.9 mg/dL (ref 8.7–10.3)
Chloride: 100 mmol/L (ref 96–106)
Creatinine, Ser: 1.69 mg/dL — ABNORMAL HIGH (ref 0.57–1.00)
GFR calc Af Amer: 31 mL/min/{1.73_m2} — ABNORMAL LOW (ref >59)
GFR calc non Af Amer: 27 mL/min/{1.73_m2} — ABNORMAL LOW (ref >59)
Glucose: 96 mg/dL (ref 65–99)
Potassium: 4.3 mmol/L (ref 3.5–5.2)
Sodium: 138 mmol/L (ref 134–144)

## 2020-04-02 LAB — DIGOXIN LEVEL: Digoxin, Serum: 1.8 ng/mL — ABNORMAL HIGH (ref 0.5–0.9)

## 2020-04-02 NOTE — Telephone Encounter (Signed)
-----   Message from Berniece Salines, DO sent at 04/02/2020 10:30 AM EDT ----- Please let the patient know that the hemoglobin hemoglobin improve is 11.1 compared to 7.6 since her lab blood work.I would like to decrease her digoxin dose to twice a week Tuesdays and Fridays due to her supratherapeutic levels.    Please also call the patient PCP [place] and let her know  the above information about her labs information. Please also tell [place] that I discussed with the patient daughter and will be referring her to EP for evaluation for AV node ablation possible pacemaker.

## 2020-04-02 NOTE — Telephone Encounter (Signed)
Spoke with the patients Jennifer Osborne at [facility] and let her know these recommendations. She verbalizes understanding and states that she will let [name], NP know.   I also called and left a detailed message on the patients daughters phone letting her know these results/recommendations. I also let her know that I had already called to notify Jones, NP and gave our call back number in case she had any other questions.    Encouragedn for patient/daughter to call back with any questions or concerns.

## 2020-04-05 ENCOUNTER — Ambulatory Visit: Payer: No Typology Code available for payment source | Admitting: Cardiology

## 2020-04-08 ENCOUNTER — Ambulatory Visit: Payer: No Typology Code available for payment source | Admitting: Cardiology

## 2020-04-12 ENCOUNTER — Telehealth: Payer: Self-pay | Admitting: Cardiology

## 2020-04-12 NOTE — Telephone Encounter (Signed)
As Long as she see the pcp in one weeek we are good until her next visit. She can stop the digoxin until that time. I saw she was admitted recently. She has a schedule visit with EP 6/29

## 2020-04-12 NOTE — Telephone Encounter (Signed)
Pt's PCP called. The PCP wanted to know if the patient needed to have a 1 week post hospital f/u.

## 2020-04-12 NOTE — Telephone Encounter (Signed)
Spoke with Jennifer Osborne at [facility] and let her know Dr. Terrial Rhodes recommendation on seeing the patient. She said that the patient has an appointment with her PCP tomorrow. She verbalizes understanding and does not have any other issues or concerns at this time.

## 2020-04-27 ENCOUNTER — Other Ambulatory Visit: Payer: Self-pay

## 2020-04-27 ENCOUNTER — Ambulatory Visit (INDEPENDENT_AMBULATORY_CARE_PROVIDER_SITE_OTHER): Payer: No Typology Code available for payment source | Admitting: Internal Medicine

## 2020-04-27 ENCOUNTER — Encounter: Payer: Self-pay | Admitting: Internal Medicine

## 2020-04-27 VITALS — BP 118/58 | HR 117 | Ht 64.0 in | Wt 108.0 lb

## 2020-04-27 DIAGNOSIS — I4891 Unspecified atrial fibrillation: Secondary | ICD-10-CM

## 2020-04-27 NOTE — Progress Notes (Signed)
HPI [name] is referred today for consideration for AV node ablation and PPM insertion. She has been on multiple medications but her digoxin was stopped recently. She has diastolic heart failure. She has had GI bleeding. She has had her systemic anti-coagulation stopped. She gets sob very quickly when she is exercising or performing activities of daily living. Prior to her uncontrolled atrial fib she was very active.  Allergies  Allergen Reactions  . Morphine And Related Itching    Pt states she had itching, no rash  . Meperidine Nausea Only  . Pregabalin Other (See Comments)    Caused stroke     Current Outpatient Medications  Medication Sig Dispense Refill  . albuterol (VENTOLIN HFA) 108 (90 Base) MCG/ACT inhaler Inhale 1 puff into the lungs every 4 (four) hours as needed for wheezing or shortness of breath.     Marland Kitchen amoxicillin-clavulanate (AUGMENTIN) 875-125 MG tablet Take 1 tablet by mouth 2 (two) times daily.    Marland Kitchen apixaban (ELIQUIS) 2.5 MG TABS tablet Take 1 tablet (2.5 mg total) by mouth 2 (two) times daily. 30 tablet 6  . Calcium Carb-Cholecalciferol (CALCIUM EXTRA D3) 500-600 MG-UNIT TABS Take 1 tablet by mouth 2 (two) times daily.    . digoxin (LANOXIN) 0.125 MG tablet Take 1 tablet (0.125 mg total) by mouth daily. 90 tablet 3  . diltiazem (DILACOR XR) 120 MG 24 hr capsule Take 120 mg by mouth in the morning and at bedtime.    . DULoxetine (CYMBALTA) 30 MG capsule Take 30 mg by mouth See admin instructions. Take along with 60mg  for a total daily dose of 90mg  daily. Take on Tuesday, Thursday, Saturday, and Sunday.    . DULoxetine (CYMBALTA) 60 MG capsule Take 60 mg by mouth See admin instructions. Take along with 30mg  for a total daily dose of 90mg  daily. Take on Tuesday, Thursday, Saturday, and Sunday.    . Fluticasone-Umeclidin-Vilant (TRELEGY ELLIPTA) 100-62.5-25 MCG/INH AEPB Inhale 1 puff into the lungs daily.    Marland Kitchen levothyroxine (SYNTHROID) 25 MCG tablet Take 25 mcg by  mouth daily before breakfast.     . mirtazapine (REMERON) 7.5 MG tablet Take 7.5 mg by mouth at bedtime.    . Multiple Vitamins-Minerals (ALIVE MULTI-VITAMIN PO) Take 1 tablet by mouth daily.     . ondansetron (ZOFRAN) 4 MG tablet Take 4 mg by mouth every 8 (eight) hours as needed for nausea or vomiting.    . pantoprazole (PROTONIX) 40 MG tablet Take 40 mg by mouth daily.     . rosuvastatin (CRESTOR) 20 MG tablet Take 20 mg by mouth daily.    Marland Kitchen spironolactone (ALDACTONE) 25 MG tablet Take 25 mg by mouth daily.    . sucralfate (CARAFATE) 1 g tablet Take 1 g by mouth 2 (two) times daily before a meal.    . tetrabenazine (XENAZINE) 12.5 MG tablet Take 12.5 mg by mouth daily.     No current facility-administered medications for this visit.     Past Medical History:  Diagnosis Date  . Abnormal involuntary movement 07/15/2019  . Abnormal weight loss   . Acute blood loss anemia 03/26/2020  . Acute GI bleeding 03/26/2020  . Ambulatory dysfunction   . Asthma   . Atrial fibrillation ([place])   . Basilar artery stenosis   . Bile reflux gastritis   . BMI 21.0-21.9, adult   . Brain bleed ([place])    history in 2019  . Cerumen impaction   . Chronic constipation   .  Chronic coronary artery disease 01/02/2020  . Chronic nausea   . COPD (chronic obstructive pulmonary disease) ([place])   . Degenerative disc disease, lumbar   . Depression with anxiety   . Diastolic CHF ([place])   . DNR (do not resuscitate) 01/02/2020  . Dysphagia   . Essential hypertension 03/08/2020  . External hemorrhoids   . Frequent falls   . Gait abnormality 07/15/2019  . Generalized anxiety disorder   . GERD (gastroesophageal reflux disease)   . Hiatal hernia   . Hyperlipemia   . Hyperlipemia   . Hypothyroidism   . Insomnia   . Internal hemorrhoids   . Intractable chronic cluster headache 12/30/2015  . Iron deficiency anemia   . Late onset Alzheimer's disease without behavioral disturbance ([place]) 01/02/2020  . Major depression   .  Migraine   . Mixed hyperlipidemia 01/02/2020  . Neurological disorder    Huntington's Similar  . Osteoarthritis, generalized   . PAD (peripheral artery disease) ([place])   . Paroxysmal atrial fibrillation ([facility])   . Pneumonia of left upper lobe due to infectious organism   . Postoperative examination 01/27/2016  . PVD (peripheral vascular disease) ([place])   . Radiculopathy    left lower leg  . Sigmoid diverticulosis   . SOB (shortness of breath)   . [place] dance   . Subarachnoid hemorrhage ([place]) 04/15/2017  . Systemic lupus erythematosus (SLE) in remission ([place])   . Urinary incontinence   . Urinary tract infection   . Vascular dementia without behavioral disturbance ([place])   . Vitamin B 12 deficiency   . Vitamin D deficiency   . Weakness     ROS:   All systems reviewed and negative except as noted in the HPI.   Past Surgical History:  Procedure Laterality Date  . ABDOMINAL HYSTERECTOMY    . APPENDECTOMY    . BACK SURGERY    . BIOPSY  03/27/2020   Procedure: BIOPSY;  Surgeon: Rush Landmark Telford Nab., MD;  Location: [facility];  Service: Gastroenterology;;  . COLONOSCOPY WITH PROPOFOL N/A 03/27/2020   Procedure: COLONOSCOPY WITH PROPOFOL;  Surgeon: Irving Copas., MD;  Location: [facility];  Service: Gastroenterology;  Laterality: N/A;  . ESOPHAGOGASTRODUODENOSCOPY (EGD) WITH PROPOFOL N/A 03/27/2020   Procedure: ESOPHAGOGASTRODUODENOSCOPY (EGD) WITH PROPOFOL;  Surgeon: Rush Landmark Telford Nab., MD;  Location: [facility];  Service: Gastroenterology;  Laterality: N/A;  . FOREIGN BODY REMOVAL  03/27/2020   Procedure: FOREIGN BODY REMOVAL;  Surgeon: Rush Landmark Telford Nab., MD;  Location: [facility];  Service: Gastroenterology;;  . Everlean Alstrom ANGIOGRAM EXTREMITY BILATERAL  01/15/2019  . IR AORTAGRAM ABDOMINAL SERIALOGRAM  01/15/2019  . IR ILIAC ART STENT INC PTA MOD SED  01/15/2019  . IR TRANSCATH PLC STENT 1ST ART NOT LE CV CAR VERT CAR  01/15/2019  . IR US GUIDE VASC ACCESS LEFT   01/15/2019  . IR US GUIDE VASC ACCESS RIGHT  01/15/2019  . POLYPECTOMY  03/27/2020   Procedure: POLYPECTOMY;  Surgeon: Mansouraty, Telford Nab., MD;  Location: [facility];  Service: Gastroenterology;;  . Lia Foyer TATTOO INJECTION  03/27/2020   Procedure: SUBMUCOSAL TATTOO INJECTION;  Surgeon: Irving Copas., MD;  Location[facility] ENDOSCOPY;  Service: Gastroenterology;;     Family History  Problem Relation Age of Onset  . Heart disease Mother   . Heart attack Father      Social History   Socioeconomic History  . Marital status: Married    Spouse name: Not on file  . Number of children: 5  .  Years of education: 32  . Highest education level: High school graduate  Occupational History  . Occupation: Retired  Tobacco Use  . Smoking status: Never Smoker  . Smokeless tobacco: Never Used  Substance and Sexual Activity  . Alcohol use: Never  . Drug use: Never  . Sexual activity: Not on file  Other Topics Concern  . Not on file  Social History Narrative   Lives at home with daughter.   Right-handed.   3-4 cups caffeine per day (Coke).   Social Determinants of Health   Financial Resource Strain:   . Difficulty of Paying Living Expenses:   Food Insecurity:   . Worried About Charity fundraiser in the Last Year:   . Arboriculturist in the Last Year:   Transportation Needs:   . Film/video editor (Medical):   Marland Kitchen Lack of Transportation (Non-Medical):   Physical Activity:   . Days of Exercise per Week:   . Minutes of Exercise per Session:   Stress:   . Feeling of Stress :   Social Connections:   . Frequency of Communication with Friends and Family:   . Frequency of Social Gatherings with Friends and Family:   . Attends Religious Services:   . Active Member of Clubs or Organizations:   . Attends Archivist Meetings:   Marland Kitchen Marital Status:   Intimate Partner Violence:   . Fear of Current or Ex-Partner:   . Emotionally Abused:   Marland Kitchen Physically Abused:   .  Sexually Abused:      BP (!) 118/58   Pulse (!) 117   Ht 5\' 4"  (1.626 m)   Wt 108 lb (49 kg)   SpO2 97%   BMI 18.54 kg/m   Physical Exam:  Well appearing NAD HEENT: Unremarkable Neck:  No JVD, no thyromegally Lymphatics:  No adenopathy Back:  No CVA tenderness Lungs:  Clear with no wheezes CV - irregular tachy rhythm, no murmurs, no rubs, no clicks Abd:  soft, positive bowel sounds, no organomegally, no rebound, no guarding Ext:  2 plus pulses, no edema, no cyanosis, no clubbing Skin:  No rashes no nodules Neuro:  CN II through XII intact, motor grossly intact  EKG - atrial fib with a RVR    Assess/Plan: 1. Uncontrolled atrial fib - her VR is uncontrolled. She is on multiple medical therapies. I have discussed the treatment options with the patient and recommended we proceed with AV node ablation and PPM insertion.  2. Chronic diastolic heart failure - her symptoms are not well controlled. Hopefully once her rate is better she will have improvement in her symptoms.   Mikle Bosworth.D.

## 2020-04-27 NOTE — H&P (View-Only) (Signed)
HPI Jennifer Osborne is referred today for consideration for AV node ablation and PPM insertion. She has been on multiple medications but her digoxin was stopped recently. She has diastolic heart failure. She has had GI bleeding. She has had her systemic anti-coagulation stopped. She gets sob very quickly when she is exercising or performing activities of daily living. Prior to her uncontrolled atrial fib she was very active.  Allergies  Allergen Reactions  . Morphine And Related Itching    Pt states she had itching, no rash  . Meperidine Nausea Only  . Pregabalin Other (See Comments)    Caused stroke     Current Outpatient Medications  Medication Sig Dispense Refill  . albuterol (VENTOLIN HFA) 108 (90 Base) MCG/ACT inhaler Inhale 1 puff into the lungs every 4 (four) hours as needed for wheezing or shortness of breath.     Marland Kitchen amoxicillin-clavulanate (AUGMENTIN) 875-125 MG tablet Take 1 tablet by mouth 2 (two) times daily.    Marland Kitchen apixaban (ELIQUIS) 2.5 MG TABS tablet Take 1 tablet (2.5 mg total) by mouth 2 (two) times daily. 30 tablet 6  . Calcium Carb-Cholecalciferol (CALCIUM EXTRA D3) 500-600 MG-UNIT TABS Take 1 tablet by mouth 2 (two) times daily.    . digoxin (LANOXIN) 0.125 MG tablet Take 1 tablet (0.125 mg total) by mouth daily. 90 tablet 3  . diltiazem (DILACOR XR) 120 MG 24 hr capsule Take 120 mg by mouth in the morning and at bedtime.    . DULoxetine (CYMBALTA) 30 MG capsule Take 30 mg by mouth See admin instructions. Take along with 60mg  for a total daily dose of 90mg  daily. Take on Tuesday, Thursday, Saturday, and Sunday.    . DULoxetine (CYMBALTA) 60 MG capsule Take 60 mg by mouth See admin instructions. Take along with 30mg  for a total daily dose of 90mg  daily. Take on Tuesday, Thursday, Saturday, and Sunday.    . Fluticasone-Umeclidin-Vilant (TRELEGY ELLIPTA) 100-62.5-25 MCG/INH AEPB Inhale 1 puff into the lungs daily.    Marland Kitchen levothyroxine (SYNTHROID) 25 MCG tablet Take 25 mcg by  mouth daily before breakfast.     . mirtazapine (REMERON) 7.5 MG tablet Take 7.5 mg by mouth at bedtime.    . Multiple Vitamins-Minerals (ALIVE MULTI-VITAMIN PO) Take 1 tablet by mouth daily.     . ondansetron (ZOFRAN) 4 MG tablet Take 4 mg by mouth every 8 (eight) hours as needed for nausea or vomiting.    . pantoprazole (PROTONIX) 40 MG tablet Take 40 mg by mouth daily.     . rosuvastatin (CRESTOR) 20 MG tablet Take 20 mg by mouth daily.    Marland Kitchen spironolactone (ALDACTONE) 25 MG tablet Take 25 mg by mouth daily.    . sucralfate (CARAFATE) 1 g tablet Take 1 g by mouth 2 (two) times daily before a meal.    . tetrabenazine (XENAZINE) 12.5 MG tablet Take 12.5 mg by mouth daily.     No current facility-administered medications for this visit.     Past Medical History:  Diagnosis Date  . Abnormal involuntary movement 07/15/2019  . Abnormal weight loss   . Acute blood loss anemia 03/26/2020  . Acute GI bleeding 03/26/2020  . Ambulatory dysfunction   . Asthma   . Atrial fibrillation ([place])   . Basilar artery stenosis   . Bile reflux gastritis   . BMI 21.0-21.9, adult   . Brain bleed ([place])    history in 2019  . Cerumen impaction   . Chronic constipation   .  Chronic coronary artery disease 01/02/2020  . Chronic nausea   . COPD (chronic obstructive pulmonary disease) ([place])   . Degenerative disc disease, lumbar   . Depression with anxiety   . Diastolic CHF ([place])   . DNR (do not resuscitate) 01/02/2020  . Dysphagia   . Essential hypertension 03/08/2020  . External hemorrhoids   . Frequent falls   . Gait abnormality 07/15/2019  . Generalized anxiety disorder   . GERD (gastroesophageal reflux disease)   . Hiatal hernia   . Hyperlipemia   . Hyperlipemia   . Hypothyroidism   . Insomnia   . Internal hemorrhoids   . Intractable chronic cluster headache 12/30/2015  . Iron deficiency anemia   . Late onset Alzheimer's disease without behavioral disturbance ([place]) 01/02/2020  . Major depression   .  Migraine   . Mixed hyperlipidemia 01/02/2020  . Neurological disorder    Huntington's Similar  . Osteoarthritis, generalized   . PAD (peripheral artery disease) ([place])   . Paroxysmal atrial fibrillation ([facility])   . Pneumonia of left upper lobe due to infectious organism   . Postoperative examination 01/27/2016  . PVD (peripheral vascular disease) ([place])   . Radiculopathy    left lower leg  . Sigmoid diverticulosis   . SOB (shortness of breath)   . [place] dance   . Subarachnoid hemorrhage ([place]) 04/15/2017  . Systemic lupus erythematosus (SLE) in remission ([place])   . Urinary incontinence   . Urinary tract infection   . Vascular dementia without behavioral disturbance ([place])   . Vitamin B 12 deficiency   . Vitamin D deficiency   . Weakness     ROS:   All systems reviewed and negative except as noted in the HPI.   Past Surgical History:  Procedure Laterality Date  . ABDOMINAL HYSTERECTOMY    . APPENDECTOMY    . BACK SURGERY    . BIOPSY  03/27/2020   Procedure: BIOPSY;  Surgeon: Rush Landmark Telford Nab., MD;  Location: [facility];  Service: Gastroenterology;;  . COLONOSCOPY WITH PROPOFOL N/A 03/27/2020   Procedure: COLONOSCOPY WITH PROPOFOL;  Surgeon: Irving Copas., MD;  Location: [facility];  Service: Gastroenterology;  Laterality: N/A;  . ESOPHAGOGASTRODUODENOSCOPY (EGD) WITH PROPOFOL N/A 03/27/2020   Procedure: ESOPHAGOGASTRODUODENOSCOPY (EGD) WITH PROPOFOL;  Surgeon: Rush Landmark Telford Nab., MD;  Location: [facility];  Service: Gastroenterology;  Laterality: N/A;  . FOREIGN BODY REMOVAL  03/27/2020   Procedure: FOREIGN BODY REMOVAL;  Surgeon: Rush Landmark Telford Nab., MD;  Location: [facility];  Service: Gastroenterology;;  . Everlean Alstrom ANGIOGRAM EXTREMITY BILATERAL  01/15/2019  . IR AORTAGRAM ABDOMINAL SERIALOGRAM  01/15/2019  . IR ILIAC ART STENT INC PTA MOD SED  01/15/2019  . IR TRANSCATH PLC STENT 1ST ART NOT LE CV CAR VERT CAR  01/15/2019  . IR US GUIDE VASC ACCESS LEFT   01/15/2019  . IR US GUIDE VASC ACCESS RIGHT  01/15/2019  . POLYPECTOMY  03/27/2020   Procedure: POLYPECTOMY;  Surgeon: Mansouraty, Telford Nab., MD;  Location: [facility];  Service: Gastroenterology;;  . Lia Foyer TATTOO INJECTION  03/27/2020   Procedure: SUBMUCOSAL TATTOO INJECTION;  Surgeon: Irving Copas., MD;  Location: [facility] ENDOSCOPY;  Service: Gastroenterology;;     Family History  Problem Relation Age of Onset  . Heart disease Mother   . Heart attack Father      Social History   Socioeconomic History  . Marital status: Married    Spouse name: Not on file  . Number of children: 5  .  Years of education: 9  . Highest education level: High school graduate  Occupational History  . Occupation: Retired  Tobacco Use  . Smoking status: Never Smoker  . Smokeless tobacco: Never Used  Substance and Sexual Activity  . Alcohol use: Never  . Drug use: Never  . Sexual activity: Not on file  Other Topics Concern  . Not on file  Social History Narrative   Lives at home with daughter.   Right-handed.   3-4 cups caffeine per day (Coke).   Social Determinants of Health   Financial Resource Strain:   . Difficulty of Paying Living Expenses:   Food Insecurity:   . Worried About Charity fundraiser in the Last Year:   . Arboriculturist in the Last Year:   Transportation Needs:   . Film/video editor (Medical):   Marland Kitchen Lack of Transportation (Non-Medical):   Physical Activity:   . Days of Exercise per Week:   . Minutes of Exercise per Session:   Stress:   . Feeling of Stress :   Social Connections:   . Frequency of Communication with Friends and Family:   . Frequency of Social Gatherings with Friends and Family:   . Attends Religious Services:   . Active Member of Clubs or Organizations:   . Attends Archivist Meetings:   Marland Kitchen Marital Status:   Intimate Partner Violence:   . Fear of Current or Ex-Partner:   . Emotionally Abused:   Marland Kitchen Physically Abused:   .  Sexually Abused:      BP (!) 118/58   Pulse (!) 117   Ht 5\' 4"  (1.626 m)   Wt 108 lb (49 kg)   SpO2 97%   BMI 18.54 kg/m   Physical Exam:  Well appearing NAD HEENT: Unremarkable Neck:  No JVD, no thyromegally Lymphatics:  No adenopathy Back:  No CVA tenderness Lungs:  Clear with no wheezes CV - irregular tachy rhythm, no murmurs, no rubs, no clicks Abd:  soft, positive bowel sounds, no organomegally, no rebound, no guarding Ext:  2 plus pulses, no edema, no cyanosis, no clubbing Skin:  No rashes no nodules Neuro:  CN II through XII intact, motor grossly intact  EKG - atrial fib with a RVR    Assess/Plan: 1. Uncontrolled atrial fib - her VR is uncontrolled. She is on multiple medical therapies. I have discussed the treatment options with the patient and recommended we proceed with AV node ablation and PPM insertion.  2. Chronic diastolic heart failure - her symptoms are not well controlled. Hopefully once her rate is better she will have improvement in her symptoms.   Mikle Bosworth.D.

## 2020-04-27 NOTE — Patient Instructions (Addendum)
Medication Instructions:  Your physician recommends that you continue on your current medications as directed. Please refer to the Current Medication list given to you today.  Labwork: None ordered.  Testing/Procedures: None ordered.  Follow-Up:  SEE INSTRUCTION LETTER  Any Other Special Instructions Will Be Listed Below (If Applicable).  If you need a refill on your cardiac medications before your next appointment, please call your pharmacy.    Cardiac Ablation Cardiac ablation is a procedure to disable (ablate) a small amount of heart tissue in very specific places. The heart has many electrical connections. Sometimes these connections are abnormal and can cause the heart to beat very fast or irregularly. Ablating some of the problem areas can improve the heart rhythm or return it to normal. Ablation may be done for people who:  Have Wolff-Parkinson-White syndrome.  Have fast heart rhythms (tachycardia).  Have taken medicines for an abnormal heart rhythm (arrhythmia) that were not effective or caused side effects.  Have a high-risk heartbeat that may be life-threatening. During the procedure, a small incision is made in the neck or the groin, and a long, thin, flexible tube (catheter) is inserted into the incision and moved to the heart. Small devices (electrodes) on the tip of the catheter will send out electrical currents. A type of X-ray (fluoroscopy) will be used to help guide the catheter and to provide images of the heart. Tell a health care provider about:  Any allergies you have.  All medicines you are taking, including vitamins, herbs, eye drops, creams, and over-the-counter medicines.  Any problems you or family members have had with anesthetic medicines.  Any blood disorders you have.  Any surgeries you have had.  Any medical conditions you have, such as kidney failure.  Whether you are pregnant or may be pregnant. What are the risks? Generally, this is a  safe procedure. However, problems may occur, including:  Infection.  Bruising and bleeding at the catheter insertion site.  Bleeding into the chest, especially into the sac that surrounds the heart. This is a serious complication.  Stroke or blood clots.  Damage to other structures or organs.  Allergic reaction to medicines or dyes.  Need for a permanent pacemaker if the normal electrical system is damaged. A pacemaker is a small computer that sends electrical signals to the heart and helps your heart beat normally.  The procedure not being fully effective. This may not be recognized until months later. Repeat ablation procedures are sometimes required. What happens before the procedure?  Follow instructions from your health care provider about eating or drinking restrictions.  Ask your health care provider about: ? Changing or stopping your regular medicines. This is especially important if you are taking diabetes medicines or blood thinners. ? Taking medicines such as aspirin and ibuprofen. These medicines can thin your blood. Do not take these medicines before your procedure if your health care provider instructs you not to.  Plan to have someone take you home from the hospital or clinic.  If you will be going home right after the procedure, plan to have someone with you for 24 hours. What happens during the procedure?  To lower your risk of infection: ? Your health care team will wash or sanitize their hands. ? Your skin will be washed with soap. ? Hair may be removed from the incision area.  An IV tube will be inserted into one of your veins.  You will be given a medicine to help you relax (sedative).  The   skin on your neck or groin will be numbed.  An incision will be made in your neck or your groin.  A needle will be inserted through the incision and into a large vein in your neck or groin.  A catheter will be inserted into the needle and moved to your  heart.  Dye may be injected through the catheter to help your surgeon see the area of the heart that needs treatment.  Electrical currents will be sent from the catheter to ablate heart tissue in desired areas. There are three types of energy that may be used to ablate heart tissue: ? Heat (radiofrequency energy). ? Laser energy. ? Extreme cold (cryoablation).  When the necessary tissue has been ablated, the catheter will be removed.  Pressure will be held on the catheter insertion area to prevent excessive bleeding.  A bandage (dressing) will be placed over the catheter insertion area. The procedure may vary among health care providers and hospitals. What happens after the procedure?  Your blood pressure, heart rate, breathing rate, and blood oxygen level will be monitored until the medicines you were given have worn off.  Your catheter insertion area will be monitored for bleeding. You will need to lie still for a few hours to ensure that you do not bleed from the catheter insertion area.  Do not drive for 24 hours or as long as directed by your health care provider. Summary  Cardiac ablation is a procedure to disable (ablate) a small amount of heart tissue in very specific places. Ablating some of the problem areas can improve the heart rhythm or return it to normal.  During the procedure, electrical currents will be sent from the catheter to ablate heart tissue in desired areas. This information is not intended to replace advice given to you by your health care provider. Make sure you discuss any questions you have with your health care provider. Document Revised: 04/08/2018 Document Reviewed: 09/04/2016 Elsevier Patient Education  [place].   Pacemaker Implantation, Adult Pacemaker implantation is a procedure to place a pacemaker inside your chest. A pacemaker is a small computer that sends electrical signals to the heart and helps your heart beat normally. A pacemaker  also stores information about your heart rhythms. You may need pacemaker implantation if you:  Have a slow heartbeat (bradycardia).  Faint (syncope).  Have shortness of breath (dyspnea) due to heart problems. The pacemaker attaches to your heart through a wire, called a lead. Sometimes just one lead is needed. Other times, there will be two leads. There are two types of pacemakers:  Transvenous pacemaker. This type is placed under the skin or muscle of your chest. The lead goes through a vein in the chest area to reach the inside of the heart.  Epicardial pacemaker. This type is placed under the skin or muscle of your chest or belly. The lead goes through your chest to the outside of the heart. Tell a health care provider about:  Any allergies you have.  All medicines you are taking, including vitamins, herbs, eye drops, creams, and over-the-counter medicines.  Any problems you or family members have had with anesthetic medicines.  Any blood or bone disorders you have.  Any surgeries you have had.  Any medical conditions you have.  Whether you are pregnant or may be pregnant. What are the risks? Generally, this is a safe procedure. However, problems may occur, including:  Infection.  Bleeding.  Failure of the pacemaker or the lead.  Collapse of a lung or bleeding into a lung.  Blood clot inside a blood vessel with a lead.  Damage to the heart.  Infection inside the heart (endocarditis).  Allergic reactions to medicines. What happens before the procedure? Staying hydrated Follow instructions from your health care provider about hydration, which may include:  Up to 2 hours before the procedure - you may continue to drink clear liquids, such as water, clear fruit juice, black coffee, and plain tea. Eating and drinking restrictions Follow instructions from your health care provider about eating and drinking, which may include:  8 hours before the procedure - stop  eating heavy meals or foods such as meat, fried foods, or fatty foods.  6 hours before the procedure - stop eating light meals or foods, such as toast or cereal.  6 hours before the procedure - stop drinking milk or drinks that contain milk.  2 hours before the procedure - stop drinking clear liquids. Medicines  Ask your health care provider about: ? Changing or stopping your regular medicines. This is especially important if you are taking diabetes medicines or blood thinners. ? Taking medicines such as aspirin and ibuprofen. These medicines can thin your blood. Do not take these medicines before your procedure if your health care provider instructs you not to.  You may be given antibiotic medicine to help prevent infection. General instructions  You will have a heart evaluation. This may include an electrocardiogram (ECG), chest X-ray, and heart imaging (echocardiogram,  or echo) tests.  You will have blood tests.  Do not use any products that contain nicotine or tobacco, such as cigarettes and e-cigarettes. If you need help quitting, ask your health care provider.  Plan to have someone take you home from the hospital or clinic.  If you will be going home right after the procedure, plan to have someone with you for 24 hours.  Ask your health care provider how your surgical site will be marked or identified. What happens during the procedure?  To reduce your risk of infection: ? Your health care team will wash or sanitize their hands. ? Your skin will be washed with soap. ? Hair may be removed from the surgical area.  An IV tube will be inserted into one of your veins.  You will be given one or more of the following: ? A medicine to help you relax (sedative). ? A medicine to numb the area (local anesthetic). ? A medicine to make you fall asleep (general anesthetic).  If you are getting a transvenous pacemaker: ? An incision will be made in your upper chest. ? A pocket will  be made for the pacemaker. It may be placed under the skin or between layers of muscle. ? The lead will be inserted into a blood vessel that returns to the heart. ? While X-rays are taken by an imaging machine (fluoroscopy), the lead will be advanced through the vein to the inside of your heart. ? The other end of the lead will be tunneled under the skin and attached to the pacemaker.  If you are getting an epicardial pacemaker: ? An incision will be made near your ribs or breastbone (sternum) for the lead. ? The lead will be attached to the outside of your heart. ? Another incision will be made in your chest or upper belly to create a pocket for the pacemaker. ? The free end of the lead will be tunneled under the skin and attached to the pacemaker.  The transvenous or epicardial pacemaker will be tested. Imaging studies may be done to check the lead position.  The incisions will be closed with stitches (sutures), adhesive strips, or skin glue.  Bandages (dressing) will be placed over the incisions. The procedure may vary among health care providers and hospitals. What happens after the procedure?  Your blood pressure, heart rate, breathing rate, and blood oxygen level will be monitored until the medicines you were given have worn off.  You will be given antibiotics and pain medicine.  ECG and chest x-rays will be done.  You will wear a continuous type of ECG (Holter monitor) to check your heart rhythm.  Your health care provider will program the pacemaker.  Do not drive for 24 hours if you received a sedative. This information is not intended to replace advice given to you by your health care provider. Make sure you discuss any questions you have with your health care provider. Document Revised: 07/05/2018 Document Reviewed: 03/29/2016 Elsevier Patient Education  [place].

## 2020-04-28 ENCOUNTER — Telehealth: Payer: Self-pay | Admitting: Internal Medicine

## 2020-04-28 NOTE — Telephone Encounter (Signed)
Faxed office notes as requested.

## 2020-04-28 NOTE — Telephone Encounter (Signed)
    Lori from [facility] calling, she said she needs to get pt's notes from visit with dr. Lovena Le yesterday to process authorization for pt's procedure tomorrow. She gave fax [phone]

## 2020-04-29 ENCOUNTER — Ambulatory Visit (HOSPITAL_COMMUNITY)
Admission: RE | Admit: 2020-04-29 | Discharge: 2020-04-30 | Disposition: A | Discharge status: Home / Self Care | Payer: No Typology Code available for payment source | Source: Ambulatory Visit | Attending: Internal Medicine | Admitting: Internal Medicine

## 2020-04-29 ENCOUNTER — Encounter (HOSPITAL_COMMUNITY)
Admission: RE | Disposition: A | Discharge status: Home / Self Care | Payer: Self-pay | Source: Ambulatory Visit | Attending: Internal Medicine

## 2020-04-29 ENCOUNTER — Other Ambulatory Visit: Payer: Self-pay

## 2020-04-29 DIAGNOSIS — Z8249 Family history of ischemic heart disease and other diseases of the circulatory system: Secondary | ICD-10-CM | POA: Insufficient documentation

## 2020-04-29 DIAGNOSIS — I739 Peripheral vascular disease, unspecified: Secondary | ICD-10-CM | POA: Diagnosis not present

## 2020-04-29 DIAGNOSIS — I2510 Atherosclerotic heart disease of native coronary artery without angina pectoris: Secondary | ICD-10-CM | POA: Insufficient documentation

## 2020-04-29 DIAGNOSIS — G301 Alzheimer's disease with late onset: Secondary | ICD-10-CM | POA: Diagnosis not present

## 2020-04-29 DIAGNOSIS — M329 Systemic lupus erythematosus, unspecified: Secondary | ICD-10-CM | POA: Diagnosis not present

## 2020-04-29 DIAGNOSIS — F0280 Dementia in other diseases classified elsewhere without behavioral disturbance: Secondary | ICD-10-CM | POA: Insufficient documentation

## 2020-04-29 DIAGNOSIS — K219 Gastro-esophageal reflux disease without esophagitis: Secondary | ICD-10-CM | POA: Insufficient documentation

## 2020-04-29 DIAGNOSIS — F329 Major depressive disorder, single episode, unspecified: Secondary | ICD-10-CM | POA: Diagnosis not present

## 2020-04-29 DIAGNOSIS — Z79899 Other long term (current) drug therapy: Secondary | ICD-10-CM | POA: Insufficient documentation

## 2020-04-29 DIAGNOSIS — I5032 Chronic diastolic (congestive) heart failure: Secondary | ICD-10-CM | POA: Diagnosis not present

## 2020-04-29 DIAGNOSIS — I480 Paroxysmal atrial fibrillation: Secondary | ICD-10-CM | POA: Insufficient documentation

## 2020-04-29 DIAGNOSIS — M1990 Unspecified osteoarthritis, unspecified site: Secondary | ICD-10-CM | POA: Insufficient documentation

## 2020-04-29 DIAGNOSIS — I110 Hypertensive heart disease with heart failure: Secondary | ICD-10-CM | POA: Insufficient documentation

## 2020-04-29 DIAGNOSIS — E782 Mixed hyperlipidemia: Secondary | ICD-10-CM | POA: Insufficient documentation

## 2020-04-29 DIAGNOSIS — Z66 Do not resuscitate: Secondary | ICD-10-CM | POA: Diagnosis not present

## 2020-04-29 DIAGNOSIS — I4891 Unspecified atrial fibrillation: Secondary | ICD-10-CM | POA: Diagnosis not present

## 2020-04-29 DIAGNOSIS — E039 Hypothyroidism, unspecified: Secondary | ICD-10-CM | POA: Diagnosis not present

## 2020-04-29 DIAGNOSIS — Z885 Allergy status to narcotic agent status: Secondary | ICD-10-CM | POA: Insufficient documentation

## 2020-04-29 DIAGNOSIS — J449 Chronic obstructive pulmonary disease, unspecified: Secondary | ICD-10-CM | POA: Diagnosis not present

## 2020-04-29 DIAGNOSIS — I482 Chronic atrial fibrillation, unspecified: Secondary | ICD-10-CM | POA: Diagnosis present

## 2020-04-29 DIAGNOSIS — Z7901 Long term (current) use of anticoagulants: Secondary | ICD-10-CM | POA: Diagnosis not present

## 2020-04-29 DIAGNOSIS — Z950 Presence of cardiac pacemaker: Secondary | ICD-10-CM

## 2020-04-29 HISTORY — PX: AV NODE ABLATION: EP1193

## 2020-04-29 HISTORY — PX: PACEMAKER IMPLANT: EP1218

## 2020-04-29 SURGERY — AV NODE ABLATION

## 2020-04-29 MED ORDER — ONDANSETRON HCL 4 MG/2ML IJ SOLN
INTRAMUSCULAR | Status: AC
  Filled 2020-04-29: qty 2

## 2020-04-29 MED ORDER — CEFAZOLIN SODIUM-DEXTROSE 2-4 GM/100ML-% IV SOLN
2.0000 g | INTRAVENOUS | Status: AC
  Administered 2020-04-29: 2 g via INTRAVENOUS

## 2020-04-29 MED ORDER — PANTOPRAZOLE SODIUM 40 MG PO TBEC
40.0000 mg | DELAYED_RELEASE_TABLET | Freq: Every day | ORAL | Status: DC
  Administered 2020-04-29 – 2020-04-30 (×2): 40 mg via ORAL
  Filled 2020-04-29 (×2): qty 1

## 2020-04-29 MED ORDER — DULOXETINE HCL 60 MG PO CPEP: 60.0000 mg | ORAL_CAPSULE | ORAL | Status: DC

## 2020-04-29 MED ORDER — ONDANSETRON HCL 4 MG/2ML IJ SOLN: 4.0000 mg | Freq: Four times a day (QID) | INTRAMUSCULAR | Status: DC | PRN

## 2020-04-29 MED ORDER — LIDOCAINE HCL 1 % IJ SOLN
INTRAMUSCULAR | Status: AC
  Filled 2020-04-29: qty 60

## 2020-04-29 MED ORDER — CEFAZOLIN SODIUM-DEXTROSE 1-4 GM/50ML-% IV SOLN
1.0000 g | Freq: Four times a day (QID) | INTRAVENOUS | Status: AC
  Administered 2020-04-29 – 2020-04-30 (×3): 1 g via INTRAVENOUS
  Filled 2020-04-29 (×3): qty 50

## 2020-04-29 MED ORDER — ACETAMINOPHEN 325 MG PO TABS
325.0000 mg | ORAL_TABLET | ORAL | Status: DC | PRN
  Administered 2020-04-29: 650 mg via ORAL
  Filled 2020-04-29: qty 2

## 2020-04-29 MED ORDER — UMECLIDINIUM BROMIDE 62.5 MCG/INH IN AEPB
1.0000 | INHALATION_SPRAY | Freq: Every day | RESPIRATORY_TRACT | Status: DC
  Filled 2020-04-29: qty 7

## 2020-04-29 MED ORDER — DULOXETINE HCL 30 MG PO CPEP: 30.0000 mg | ORAL_CAPSULE | ORAL | Status: DC

## 2020-04-29 MED ORDER — OXYCODONE HCL 5 MG PO TABS
5.0000 mg | ORAL_TABLET | Freq: Once | ORAL | Status: AC
  Administered 2020-04-29: 5 mg via ORAL
  Filled 2020-04-29: qty 1

## 2020-04-29 MED ORDER — SODIUM CHLORIDE 0.9 % IV SOLN
INTRAVENOUS | Status: AC
  Filled 2020-04-29: qty 2

## 2020-04-29 MED ORDER — CALCIUM CARBONATE-VITAMIN D 500-200 MG-UNIT PO TABS
1.0000 | ORAL_TABLET | Freq: Every day | ORAL | Status: DC
  Administered 2020-04-30: 1 via ORAL
  Filled 2020-04-29: qty 1

## 2020-04-29 MED ORDER — TETRABENAZINE 12.5 MG PO TABS: 12.5000 mg | ORAL_TABLET | Freq: Every day | ORAL | Status: DC

## 2020-04-29 MED ORDER — CALCIUM CARB-CHOLECALCIFEROL 500-600 MG-UNIT PO TABS: 1.0000 | ORAL_TABLET | Freq: Every day | ORAL | Status: DC

## 2020-04-29 MED ORDER — MIDAZOLAM HCL 5 MG/5ML IJ SOLN
INTRAMUSCULAR | Status: AC
  Filled 2020-04-29: qty 5

## 2020-04-29 MED ORDER — FLUTICASONE-UMECLIDIN-VILANT 100-62.5-25 MCG/INH IN AEPB: 1.0000 | INHALATION_SPRAY | Freq: Every day | RESPIRATORY_TRACT | Status: DC

## 2020-04-29 MED ORDER — ALBUTEROL SULFATE (2.5 MG/3ML) 0.083% IN NEBU: 2.5000 mg | INHALATION_SOLUTION | RESPIRATORY_TRACT | Status: DC | PRN

## 2020-04-29 MED ORDER — LIDOCAINE HCL (PF) 1 % IJ SOLN
INTRAMUSCULAR | Status: DC | PRN
  Administered 2020-04-29: 60 mL

## 2020-04-29 MED ORDER — BUPIVACAINE HCL (PF) 0.25 % IJ SOLN
INTRAMUSCULAR | Status: AC
  Filled 2020-04-29: qty 30

## 2020-04-29 MED ORDER — ONDANSETRON HCL 4 MG PO TABS: 4.0000 mg | ORAL_TABLET | Freq: Three times a day (TID) | ORAL | Status: DC | PRN

## 2020-04-29 MED ORDER — SODIUM CHLORIDE 0.9 % IV SOLN: INTRAVENOUS | Status: DC

## 2020-04-29 MED ORDER — ONDANSETRON HCL 4 MG/2ML IJ SOLN
INTRAMUSCULAR | Status: DC | PRN
  Administered 2020-04-29: 2 mg via INTRAVENOUS

## 2020-04-29 MED ORDER — SODIUM CHLORIDE 0.9 % IV SOLN
80.0000 mg | INTRAVENOUS | Status: AC
  Administered 2020-04-29: 80 mg

## 2020-04-29 MED ORDER — FLUTICASONE FUROATE-VILANTEROL 100-25 MCG/INH IN AEPB
1.0000 | INHALATION_SPRAY | Freq: Every day | RESPIRATORY_TRACT | Status: DC
  Filled 2020-04-29: qty 28

## 2020-04-29 MED ORDER — ADULT MULTIVITAMIN W/MINERALS CH
1.0000 | ORAL_TABLET | Freq: Every day | ORAL | Status: DC
  Administered 2020-04-30: 1 via ORAL
  Filled 2020-04-29: qty 1

## 2020-04-29 MED ORDER — SPIRONOLACTONE 25 MG PO TABS
25.0000 mg | ORAL_TABLET | Freq: Every day | ORAL | Status: DC
  Administered 2020-04-30: 25 mg via ORAL
  Filled 2020-04-29: qty 1

## 2020-04-29 MED ORDER — LEVOTHYROXINE SODIUM 75 MCG PO TABS
37.5000 ug | ORAL_TABLET | Freq: Every day | ORAL | Status: DC
  Administered 2020-04-30: 37.5 ug via ORAL
  Filled 2020-04-29: qty 1

## 2020-04-29 MED ORDER — BUPIVACAINE HCL (PF) 0.25 % IJ SOLN
INTRAMUSCULAR | Status: DC | PRN
  Administered 2020-04-29: 30 mL

## 2020-04-29 MED ORDER — FENTANYL CITRATE (PF) 100 MCG/2ML IJ SOLN
INTRAMUSCULAR | Status: DC | PRN
  Administered 2020-04-29 (×3): 12.5 ug via INTRAVENOUS

## 2020-04-29 MED ORDER — CHLORHEXIDINE GLUCONATE 4 % EX LIQD
4.0000 "application " | Freq: Once | CUTANEOUS | Status: DC
  Filled 2020-04-29: qty 60

## 2020-04-29 MED ORDER — CEFAZOLIN SODIUM-DEXTROSE 2-4 GM/100ML-% IV SOLN
INTRAVENOUS | Status: AC
  Filled 2020-04-29: qty 100

## 2020-04-29 MED ORDER — MIDAZOLAM HCL 5 MG/5ML IJ SOLN
INTRAMUSCULAR | Status: DC | PRN
  Administered 2020-04-29 (×3): 1 mg via INTRAVENOUS

## 2020-04-29 MED ORDER — HEPARIN (PORCINE) IN NACL 1000-0.9 UT/500ML-% IV SOLN
INTRAVENOUS | Status: DC | PRN
  Administered 2020-04-29 (×2): 500 mL

## 2020-04-29 MED ORDER — ROSUVASTATIN CALCIUM 20 MG PO TABS
20.0000 mg | ORAL_TABLET | Freq: Every day | ORAL | Status: DC
  Administered 2020-04-29 – 2020-04-30 (×2): 20 mg via ORAL
  Filled 2020-04-29 (×2): qty 1

## 2020-04-29 MED ORDER — FENTANYL CITRATE (PF) 100 MCG/2ML IJ SOLN
INTRAMUSCULAR | Status: AC
  Filled 2020-04-29: qty 2

## 2020-04-29 SURGICAL SUPPLY — 16 items
CABLE SURGICAL S-101-97-12 (CABLE) ×3 IMPLANT
CATH CELSIUS THERMO F CV 7FR (ABLATOR) ×2 IMPLANT
CATH RIGHTSITE C315HIS02 (CATHETERS) ×2 IMPLANT
IPG PACE AZUR XT DR MRI W1DR01 (Pacemaker) IMPLANT
LEAD CAPSURE NOVUS 5076-52CM (Lead) IMPLANT
LEAD SELECT SECURE 3830 383069 (Lead) IMPLANT
PACE AZURE XT DR MRI W1DR01 (Pacemaker) ×3 IMPLANT
PACK EP LATEX FREE (CUSTOM PROCEDURE TRAY) ×3
PACK EP LF (CUSTOM PROCEDURE TRAY) ×1 IMPLANT
PAD PRO RADIOLUCENT 2001M-C (PAD) ×3 IMPLANT
SELECT SECURE 3830 383069 (Lead) ×3 IMPLANT
SHEATH 7FR PRELUDE SNAP 13 (SHEATH) ×4 IMPLANT
SHEATH PINNACLE 8F 10CM (SHEATH) ×2 IMPLANT
SLITTER 6232ADJ (MISCELLANEOUS) ×2 IMPLANT
TRAY PACEMAKER INSERTION (PACKS) ×3 IMPLANT
WIRE HI TORQ VERSACORE-J 145CM (WIRE) ×2 IMPLANT

## 2020-04-29 NOTE — Discharge Instructions (Signed)
R groin site care instructions No lifting over 5 lbs for 1 week. No vigorous or sexual activity for 1 week.  Keep procedure site clean & dry. If you notice increased pain, swelling, bleeding or pus, call/return!  You may shower, but no soaking baths/hot tubs/pools for 1 week.       Supplemental Discharge Instructions for  Pacemaker/Defibrillator Patients  Activity No heavy lifting or vigorous activity with your left/right arm for 6 to 8 weeks.  Do not raise your left/right arm above your head for one week.  Gradually raise your affected arm as drawn below.              05/03/2020                  05/04/2020                  05/05/2020                05/06/2020 __  NO DRIVING for  1 week   ; you may begin driving on  0/10/930  .  WOUND CARE - Keep the wound area clean and dry.  Do not get this area wet for one week. No showers for one week; you may shower on  05/06/2020  . - The tape/steri-strips on your wound will fall off; do not pull them off.  No bandage is needed on the site.  DO  NOT apply any creams, oils, or ointments to the wound area. - If you notice any drainage or discharge from the wound, any swelling or bruising at the site, or you develop a fever > 101? F after you are discharged home, call the office at once.  Special Instructions - You are still able to use cellular telephones; use the ear opposite the side where you have your pacemaker/defibrillator.  Avoid carrying your cellular phone near your device. - When traveling through airports, show security personnel your identification card to avoid being screened in the metal detectors.  Ask the security personnel to use the hand wand. - Avoid arc welding equipment, MRI testing (magnetic resonance imaging), TENS units (transcutaneous nerve stimulators).  Call the office for questions about other devices. - Avoid electrical appliances that are in poor condition or are not properly grounded. - Microwave ovens are safe to be near or to  operate.

## 2020-04-29 NOTE — Progress Notes (Signed)
Dr Lovena Le aware that Jennifer Osborne has been held since 04-26-2020. Jennifer Osborne in cath lab notified that pt needs Left IV access. Will do in cath lab.

## 2020-04-29 NOTE — Interval H&P Note (Signed)
History and Physical Interval Note:  04/29/2020 1:55 PM  Jennifer Osborne  has presented today for surgery, with the diagnosis of afib - rvr.  The various methods of treatment have been discussed with the patient and family. After consideration of risks, benefits and other options for treatment, the patient has consented to  Procedure(s): AV NODE ABLATION (N/A) PACEMAKER IMPLANT (N/A) as a surgical intervention.  The patient's history has been reviewed, patient examined, no change in status, stable for surgery.  I have reviewed the patient's chart and labs.  Questions were answered to the patient's satisfaction.     Cristopher Peru

## 2020-04-30 ENCOUNTER — Encounter (HOSPITAL_COMMUNITY): Payer: Self-pay | Admitting: Internal Medicine

## 2020-04-30 ENCOUNTER — Ambulatory Visit (HOSPITAL_COMMUNITY): Payer: No Typology Code available for payment source

## 2020-04-30 DIAGNOSIS — I110 Hypertensive heart disease with heart failure: Secondary | ICD-10-CM | POA: Diagnosis not present

## 2020-04-30 DIAGNOSIS — Z66 Do not resuscitate: Secondary | ICD-10-CM | POA: Diagnosis not present

## 2020-04-30 DIAGNOSIS — I482 Chronic atrial fibrillation, unspecified: Secondary | ICD-10-CM | POA: Diagnosis not present

## 2020-04-30 DIAGNOSIS — I5032 Chronic diastolic (congestive) heart failure: Secondary | ICD-10-CM | POA: Diagnosis not present

## 2020-04-30 MED ORDER — TRAMADOL HCL 50 MG PO TABS
50.0000 mg | ORAL_TABLET | Freq: Once | ORAL | Status: AC
  Administered 2020-04-30: 50 mg via ORAL
  Filled 2020-04-30: qty 1

## 2020-04-30 MED FILL — Lidocaine HCl Local Inj 1%: INTRAMUSCULAR | Qty: 60 | Status: AC

## 2020-04-30 NOTE — Discharge Summary (Addendum)
ELECTROPHYSIOLOGY PROCEDURE DISCHARGE SUMMARY    Patient ID: Jennifer Osborne,  MRN: [id number], DOB/AGE: 05/27/1931 86 y.o.  Admit date: 04/29/2020 Discharge date: 04/30/2020  Primary Care Physician: [name], NP  Primary Cardiologist: Dr. Harriet Masson Electrophysiologist: Dr. Lovena Le  Primary Discharge Diagnosis:  1. AFib uncontrolled     CHA2DS2Vasc is 6, on Eliquis, appropriately dosed 2. AVNode ablation 3. PPM implant  Secondary Discharge Diagnosis:  1. CAD 2. Chronic CHF (diastolic) 3. HTN  Allergies  Allergen Reactions  . Morphine And Related Itching    Pt states she had itching, no rash  . Meperidine Nausea Only  . Pregabalin Other (See Comments)    Caused stroke     Procedures This Admission:  1.  Implantation of a MDT single chamber PPM on 04/29/2020 by Dr Lovena Le.  The patient received a medtronic (serial number [id number]) pacemaker, Medtronic  [id number] (serial number [id number]) right ventricular lead  There were no immediate post procedure complications. CXR on 7/2/201 demonstrated no pneumothorax status post device implantation.   2. 04/29/2020 AVNode ablation    Brief HPI: Jennifer Osborne is a 86 y.o. female was referred to electrophysiology in the outpatient setting for consideration of AVNode ablation and PPM implantation 2/2 uncontrolled Afib and diastolic HF.  Past medical history includes above.  Risks, benefits, and alternatives to PPM implantation and AV node ablation were reviewed with the patient who wished to proceed.   Hospital Course:  The patient was admitted and underwent implantation of a PPM followed by AV node ablation, see procedure reports for full details She was monitored on telemetry overnight which demonstrated AF/V paced rhythm.  Left chest was without hematoma or ecchymosis.  The device was interrogated and found to be functioning normally.  CXR was obtained and demonstrated no pneumothorax status post device implantation.  Wound care, arm  mobility, and restrictions were reviewed with the patient.  The patient feels well, slept well last noght, no CP or SOB, she was examined by Dr. Lovena Le who considered stable for discharge to home.   NO ELIQUIS until Monday   Physical Exam: Vitals:   04/29/20 2055 04/30/20 0025 04/30/20 0659 04/30/20 0856  BP: 133/65 (!) 151/77 (!) 154/86 (!) 143/69  Pulse: 89   90  Resp: 17 16 16 16   Temp: 98.1 F (36.7 C) 98.2 F (36.8 C) 98.4 F (36.9 C) 98 F (36.7 C)  TempSrc: Oral Oral Oral Oral  SpO2:    99%  Weight:   51.4 kg   Height:        GEN- The patient is well appearing, alert and oriented x 3 today.   HEENT: normocephalic, atraumatic; sclera clear, conjunctiva pink; hearing intact; oropharynx clear; neck supple, no JVP Lungs- CTA b/l, normal work of breathing.  No wheezes, rales, rhonchi Heart- RRR, no murmurs, rubs or gallops, PMI not laterally displaced GI- soft, non-tender, non-distended Extremities- no clubbing, cyanosis, or edema MS- no significant deformity or atrophy Skin- warm and dry, no rash or lesion, left chest without hematoma/ecchymosis Psych- euthymic mood, full affect Neuro- no gross deficits   Labs:   Lab Results  Component Value Date   WBC 7.2 04/01/2020   HGB 11.1 04/01/2020   HCT 34.1 04/01/2020   MCV 86 04/01/2020   PLT 244 04/01/2020   No results for input(s): NA, K, CL, CO2, BUN, CREATININE, CALCIUM, PROT, BILITOT, ALKPHOS, ALT, AST, GLUCOSE in the last 168 hours.  Invalid input(s): LABALBU  Discharge Medications:  Allergies  as of 04/30/2020      Reactions   Morphine And Related Itching   Pt states she had itching, no rash   Meperidine Nausea Only   Pregabalin Other (See Comments)   Caused stroke      Medication List    TAKE these medications   albuterol 108 (90 Base) MCG/ACT inhaler Commonly known as: VENTOLIN HFA Inhale 1 puff into the lungs every 4 (four) hours as needed for wheezing or shortness of breath.   apixaban 2.5 MG Tabs  tablet Commonly known as: ELIQUIS Take 1 tablet (2.5 mg total) by mouth 2 (two) times daily. Notes to patient: Do not resume until Monday 05/03/2020 morning   Calcium Extra D3 500-600 MG-UNIT Tabs Generic drug: Calcium Carb-Cholecalciferol Take 1 tablet by mouth daily.   diltiazem 120 MG 24 hr capsule Commonly known as: DILACOR XR Take 120 mg by mouth daily.   DULoxetine 30 MG capsule Commonly known as: CYMBALTA Take 30 mg by mouth See admin instructions. Take along with 60mg  for a total daily dose of 90mg  daily. Take on Tuesday, Thursday, Saturday, and Sunday.   DULoxetine 60 MG capsule Commonly known as: CYMBALTA Take 60 mg by mouth See admin instructions. Take along with 30mg  for a total daily dose of 90mg  daily. Take on Tuesday, Thursday, Saturday, and Sunday.   levothyroxine 25 MCG tablet Commonly known as: SYNTHROID Take 37.5 mcg by mouth daily before breakfast.   multivitamin with minerals tablet Take 1 tablet by mouth daily. Centrum complete   ondansetron 4 MG tablet Commonly known as: ZOFRAN Take 4 mg by mouth every 8 (eight) hours as needed for nausea or vomiting.   pantoprazole 40 MG tablet Commonly known as: PROTONIX Take 40 mg by mouth daily.   rosuvastatin 20 MG tablet Commonly known as: CRESTOR Take 20 mg by mouth daily.   spironolactone 25 MG tablet Commonly known as: ALDACTONE Take 25 mg by mouth daily.   tetrabenazine 12.5 MG tablet Commonly known as: XENAZINE Take 12.5 mg by mouth daily.   Trelegy Ellipta 100-62.5-25 MCG/INH Aepb Generic drug: Fluticasone-Umeclidin-Vilant Inhale 1 puff into the lungs daily.   Voltaren 1 % Gel Generic drug: diclofenac Sodium Apply 2 g topically daily as needed (pain).       Disposition:  Home   Discharge Instructions    Diet - low sodium heart healthy   Complete by: As directed    Increase activity slowly   Complete by: As directed       Follow-up Information    [place] [facility]  Follow up.   Specialty: Cardiology Why: 05/13/2020 @ 2:00PM, wound check visit and 05/27/2020 @ 11:30AM for routine pacemaker check and programming Contact information: [address] [place]       Evans Lance, MD Follow up.   Specialty: Cardiology Why: 08/10/2020 @ 3:00PM Contact information: [address] [address] [phone]               Duration of Discharge Encounter: Greater than 30 minutes including physician time.  Venetia Night, PA-C 04/30/2020 10:24 AM  EP attending  Patient seen and examined. Agree with the findings as note above. She is doing well after AV node ablation and PPM insertion. She can be discharged home with usual followup. Interrogation of her PPM demonstrates normal single chamber PM function. CXR demonstrates stable lead position. DC home with usual followup.  Mikle Bosworth.D.  Mikle Bosworth.D.

## 2020-05-06 ENCOUNTER — Ambulatory Visit: Payer: No Typology Code available for payment source | Admitting: Cardiology

## 2020-05-12 ENCOUNTER — Ambulatory Visit (INDEPENDENT_AMBULATORY_CARE_PROVIDER_SITE_OTHER): Payer: No Typology Code available for payment source | Admitting: Cardiology

## 2020-05-12 ENCOUNTER — Other Ambulatory Visit: Payer: Self-pay

## 2020-05-12 ENCOUNTER — Telehealth: Payer: Self-pay | Admitting: Emergency Medicine

## 2020-05-12 ENCOUNTER — Encounter: Payer: Self-pay | Admitting: Cardiology

## 2020-05-12 VITALS — BP 154/80 | HR 88 | Ht 64.5 in | Wt 110.0 lb

## 2020-05-12 DIAGNOSIS — Z950 Presence of cardiac pacemaker: Secondary | ICD-10-CM | POA: Diagnosis not present

## 2020-05-12 DIAGNOSIS — I10 Essential (primary) hypertension: Secondary | ICD-10-CM

## 2020-05-12 DIAGNOSIS — I4821 Permanent atrial fibrillation: Secondary | ICD-10-CM

## 2020-05-12 DIAGNOSIS — E782 Mixed hyperlipidemia: Secondary | ICD-10-CM

## 2020-05-12 NOTE — Progress Notes (Signed)
Cardiology Office Note:    Date:  05/12/2020   ID:  Jennifer Osborne, DOB 02/07/1931, MRN [id number]  PCP:  [name], NP  Cardiologist:  Berniece Salines, DO  Electrophysiologist:  None   Referring MD: [name], NP   Chief Complaint  Patient presents with  . Follow-up  I am doing great  History of Present Illness:    Jennifer Osborne a 86 y.o.femalewith a hx of atrial fibrillation, coronary artery disease,diastolic heart failure ejection fraction back in 2018 55 to 78%, diastolic dysfunction, hyperlipidemia, hypertension, history of subarachnoid hemorrhage history of subarachnoid hemorrhage in June 2018 per records the patient did have a CT head at [facility] which is concerning for small right temporal subarachnoid hemorrhage she was therefore transferred to [facility] on April 15, 2017. Underwent CT of the head which did not show any subarachnoid hemorrhage. She was seen by neurosurgery discharged home -her Eliquis was also continued.  I did see the patient on February 09, 2020 at that time she was presenting due to atrial fibrillation with rapid ventricular rate. Started patient on digoxin due to hypotension and fast heart rate.  Also the patient on Mar 08, 2020 at that time we discussed her echocardiogram which show EF of 60 to 65%, severely dilated left atrium, mild annular calcification with mild regurgitation and mild pulmonary hypertension.  The patient underwent a cardioversion, unfortunately she was not able to stay in sinus rhythm.  Subsequently she was admitted to [facility] and then transferred to [facility] for for GI bleeding.  She did undergo endoscopy which reported no gross lesions were noted in the entire esophagus, stomach and duodenal bulb.  Also underwent a colonoscopy with did not show any active bleeding.  She was placed back on anticoagulation discharged home.   I saw the patient on June 3,2021 at that time I  referred her to EP for consideration for AV node ablation pacemaker.  She was seen by Dr. Crissie Sickles.  She did have her pacemaker implantation on April 29, 2020.  Since her pacemaker implantation she has been doing well.  She is happy and states that her shortness of breath has improved significantly she was able to go to the beach with her family last weekend.  Past Medical History:  Diagnosis Date  . Abnormal involuntary movement 07/15/2019  . Abnormal weight loss   . Acute blood loss anemia 03/26/2020  . Acute GI bleeding 03/26/2020  . Ambulatory dysfunction   . Asthma   . Atrial fibrillation ([place])   . Basilar artery stenosis   . Bile reflux gastritis   . BMI 21.0-21.9, adult   . Brain bleed ([place])    history in 2019  . Cerumen impaction   . Chronic constipation   . Chronic coronary artery disease 01/02/2020  . Chronic nausea   . COPD (chronic obstructive pulmonary disease) ([place])   . Degenerative disc disease, lumbar   . Depression with anxiety   . Diastolic CHF ([place])   . DNR (do not resuscitate) 01/02/2020  . Dysphagia   . Essential hypertension 03/08/2020  . External hemorrhoids   . Frequent falls   . Gait abnormality 07/15/2019  . Generalized anxiety disorder   . GERD (gastroesophageal reflux disease)   . Hiatal hernia   . Hyperlipemia   . Hyperlipemia   . Hypothyroidism   . Insomnia   . Internal hemorrhoids   . Intractable chronic cluster headache 12/30/2015  . Iron  deficiency anemia   . Late onset Alzheimer's disease without behavioral disturbance ([place]) 01/02/2020  . Major depression   . Migraine   . Mixed hyperlipidemia 01/02/2020  . Neurological disorder    [place]'s Similar  . Osteoarthritis, generalized   . PAD (peripheral artery disease) ([place])   . Paroxysmal atrial fibrillation (HCC)   . Pneumonia of left upper lobe due to infectious organism   . Postoperative examination 01/27/2016  . PVD (peripheral vascular disease) ([place])   . Radiculopathy    left lower leg  .  Sigmoid diverticulosis   . SOB (shortness of breath)   . [place] dance   . Subarachnoid hemorrhage ([place]) 04/15/2017  . Systemic lupus erythematosus (SLE) in remission ([place])   . Urinary incontinence   . Urinary tract infection   . Vascular dementia without behavioral disturbance ([place])   . Vitamin B 12 deficiency   . Vitamin D deficiency   . Weakness     Past Surgical History:  Procedure Laterality Date  . ABDOMINAL HYSTERECTOMY    . APPENDECTOMY    . AV NODE ABLATION N/A 04/29/2020   Procedure: AV NODE ABLATION;  Surgeon: Evans Lance, MD;  Location: [place] [facility];  Service: Cardiovascular;  Laterality: N/A;  . BACK SURGERY    . BIOPSY  03/27/2020   Procedure: BIOPSY;  Surgeon: Rush Landmark Telford Nab., MD;  Location: [place];  Service: Gastroenterology;;  . COLONOSCOPY WITH PROPOFOL N/A 03/27/2020   Procedure: COLONOSCOPY WITH PROPOFOL;  Surgeon: Irving Copas., MD;  Location: [place];  Service: Gastroenterology;  Laterality: N/A;  . ESOPHAGOGASTRODUODENOSCOPY (EGD) WITH PROPOFOL N/A 03/27/2020   Procedure: ESOPHAGOGASTRODUODENOSCOPY (EGD) WITH PROPOFOL;  Surgeon: Rush Landmark Telford Nab., MD;  Location: [place];  Service: Gastroenterology;  Laterality: N/A;  . FOREIGN BODY REMOVAL  03/27/2020   Procedure: FOREIGN BODY REMOVAL;  Surgeon: Rush Landmark Telford Nab., MD;  Location: [place];  Service: Gastroenterology;;  . Everlean Alstrom ANGIOGRAM EXTREMITY BILATERAL  01/15/2019  . IR AORTAGRAM ABDOMINAL SERIALOGRAM  01/15/2019  . IR ILIAC ART STENT INC PTA MOD SED  01/15/2019  . IR TRANSCATH PLC STENT 1ST ART NOT LE CV CAR VERT CAR  01/15/2019  . IR US GUIDE VASC ACCESS LEFT  01/15/2019  . IR US GUIDE VASC ACCESS RIGHT  01/15/2019  . PACEMAKER IMPLANT N/A 04/29/2020   Procedure: PACEMAKER IMPLANT;  Surgeon: Evans Lance, MD;  Location: [place] [facility];  Service: Cardiovascular;  Laterality: N/A;  . POLYPECTOMY  03/27/2020   Procedure: POLYPECTOMY;  Surgeon: Rush Landmark  Telford Nab., MD;  Location: [place];  Service: Gastroenterology;;  . Lia Foyer TATTOO INJECTION  03/27/2020   Procedure: SUBMUCOSAL TATTOO INJECTION;  Surgeon: Irving Copas., MD;  Location: [place];  Service: Gastroenterology;;    Current Medications: Current Meds  Medication Sig  . albuterol (VENTOLIN HFA) 108 (90 Base) MCG/ACT inhaler Inhale 1 puff into the lungs every 4 (four) hours as needed for wheezing or shortness of breath.   Marland Kitchen apixaban (ELIQUIS) 2.5 MG TABS tablet Take 1 tablet (2.5 mg total) by mouth 2 (two) times daily.  . Calcium Carb-Cholecalciferol (CALCIUM EXTRA D3) 500-600 MG-UNIT TABS Take 1 tablet by mouth daily.   . diclofenac Sodium (VOLTAREN) 1 % GEL Apply 2 g topically daily as needed (pain).  Marland Kitchen diltiazem (DILACOR XR) 120 MG 24 hr capsule Take 120 mg by mouth daily.   . DULoxetine (CYMBALTA) 30 MG capsule Take 30 mg by mouth See admin instructions. Take along with 60mg  for  a total daily dose of 90mg  daily. Take on Tuesday, Thursday, Saturday, and Sunday.  . DULoxetine (CYMBALTA) 60 MG capsule Take 60 mg by mouth See admin instructions. Take along with 30mg  for a total daily dose of 90mg  daily. Take on Tuesday, Thursday, Saturday, and Sunday.  . Fluticasone-Umeclidin-Vilant (TRELEGY ELLIPTA) 100-62.5-25 MCG/INH AEPB Inhale 1 puff into the lungs daily.  Marland Kitchen levothyroxine (SYNTHROID) 25 MCG tablet Take 37.5 mcg by mouth daily before breakfast.   . Multiple Vitamins-Minerals (MULTIVITAMIN WITH MINERALS) tablet Take 1 tablet by mouth daily. Centrum complete  . ondansetron (ZOFRAN) 4 MG tablet Take 4 mg by mouth every 8 (eight) hours as needed for nausea or vomiting.  . pantoprazole (PROTONIX) 40 MG tablet Take 40 mg by mouth daily.   . rosuvastatin (CRESTOR) 20 MG tablet Take 20 mg by mouth daily.  Marland Kitchen spironolactone (ALDACTONE) 25 MG tablet Take 25 mg by mouth daily.  Marland Kitchen tetrabenazine (XENAZINE) 12.5 MG tablet Take 12.5 mg by mouth daily.     Allergies:    Morphine and related, Meperidine, and Pregabalin   Social History   Socioeconomic History  . Marital status: Married    Spouse name: Not on file  . Number of children: 5  . Years of education: 15  . Highest education level: High school graduate  Occupational History  . Occupation: Retired  Tobacco Use  . Smoking status: Never Smoker  . Smokeless tobacco: Never Used  Substance and Sexual Activity  . Alcohol use: Never  . Drug use: Never  . Sexual activity: Not on file  Other Topics Concern  . Not on file  Social History Narrative   Lives at home with daughter.   Right-handed.   3-4 cups caffeine per day (Coke).   Social Determinants of Health   Financial Resource Strain:   . Difficulty of Paying Living Expenses:   Food Insecurity:   . Worried About Charity fundraiser in the Last Year:   . Arboriculturist in the Last Year:   Transportation Needs:   . Film/video editor (Medical):   Marland Kitchen Lack of Transportation (Non-Medical):   Physical Activity:   . Days of Exercise per Week:   . Minutes of Exercise per Session:   Stress:   . Feeling of Stress :   Social Connections:   . Frequency of Communication with Friends and Family:   . Frequency of Social Gatherings with Friends and Family:   . Attends Religious Services:   . Active Member of Clubs or Organizations:   . Attends Archivist Meetings:   Marland Kitchen Marital Status:      Family History: The patient's family history includes Heart attack in her father; Heart disease in her mother.  ROS:   Review of Systems  Constitution: Negative for decreased appetite, fever and weight gain.  HENT: Negative for congestion, ear discharge, hoarse voice and sore throat.   Eyes: Negative for discharge, redness, vision loss in right eye and visual halos.  Cardiovascular: Negative for chest pain, dyspnea on exertion, leg swelling, orthopnea and palpitations.  Respiratory: Negative for cough, hemoptysis, shortness of breath and  snoring.   Endocrine: Negative for heat intolerance and polyphagia.  Hematologic/Lymphatic: Negative for bleeding problem. Does not bruise/bleed easily.  Skin: Negative for flushing, nail changes, rash and suspicious lesions.  Musculoskeletal: Negative for arthritis, joint pain, muscle cramps, myalgias, neck pain and stiffness.  Gastrointestinal: Negative for abdominal pain, bowel incontinence, diarrhea and excessive appetite.  Genitourinary: Negative for  decreased libido, genital sores and incomplete emptying.  Neurological: Negative for brief paralysis, focal weakness, headaches and loss of balance.  Psychiatric/Behavioral: Negative for altered mental status, depression and suicidal ideas.  Allergic/Immunologic: Negative for HIV exposure and persistent infections.    EKGs/Labs/Other Studies Reviewed:    The following studies were reviewed today:   EKG: None today  Recent Labs: 03/26/2020: ALT 15; TSH 0.916 04/01/2020: BUN 6; Creatinine, Ser 1.69; Hemoglobin 11.1; Magnesium 1.9; Platelets 244; Potassium 4.3; Sodium 138  Recent Lipid Panel No results found for: CHOL, TRIG, HDL, CHOLHDL, VLDL, LDLCALC, LDLDIRECT  Physical Exam:    VS:  BP (!) 154/80 (BP Location: Right Arm, Patient Position: Sitting, Cuff Size: Small)   Pulse 88   Ht 5' 4.5" (1.638 m)   Wt 110 lb (49.9 kg)   SpO2 99%   BMI 18.59 kg/m     Wt Readings from Last 3 Encounters:  05/12/20 110 lb (49.9 kg)  04/30/20 113 lb 5.1 oz (51.4 kg)  04/27/20 108 lb (49 kg)     GEN: Well nourished, well developed in no acute distress HEENT: Normal NECK: No JVD; No carotid bruits LYMPHATICS: No lymphadenopathy CARDIAC: S1S2 noted,RRR, no murmurs, rubs, gallops RESPIRATORY:  Clear to auscultation without rales, wheezing or rhonchi  ABDOMEN: Soft, non-tender, non-distended, +bowel sounds, no guarding. EXTREMITIES: No edema, No cyanosis, no clubbing MUSCULOSKELETAL:  No deformity  SKIN: Warm and dry NEUROLOGIC:  Alert and  oriented x 3, non-focal PSYCHIATRIC:  Normal affect, good insight  ASSESSMENT:    1. Permanent atrial fibrillation ([place])   2. Essential hypertension   3. Mixed hyperlipidemia   4. Pacemaker    PLAN:     She seems to be doing very well from a symptom standpoint.  Shortness of breath has improved.  She is doing a lot more activities now.  Unfortunately today her blood pressure is elevated in the office this is an isolated reading as the patient has previously been lower.  I will request that the patient get her blood pressure taken twice a day at the facility for about a week and send this information to me.  If she continues to be elevated then will optimize her antihypertensive medication. Continue patient her Crestor 20 mg daily.  The patient is in agreement with the above plan. The patient left the office in stable condition.  The patient will follow up in 6 months or sooner if needed.   Medication Adjustments/Labs and Tests Ordered: Current medicines are reviewed at length with the patient today.  Concerns regarding medicines are outlined above.  No orders of the defined types were placed in this encounter.  No orders of the defined types were placed in this encounter.   Patient Instructions  Medication Instructions:  No medication changes. *If you need a refill on your cardiac medications before your next appointment, please call your pharmacy*   Lab Work: None ordered If you have labs (blood work) drawn today and your tests are completely normal, you will receive your results only by: Marland Kitchen MyChart Message (if you have MyChart) OR . A paper copy in the mail If you have any lab test that is abnormal or we need to change your treatment, we will call you to review the results.   Testing/Procedures: None ordered   Follow-Up: At [facility], you and your health needs are our priority.  As part of our continuing mission to provide you with exceptional heart care, we have  created designated Provider Care  Teams.  These Care Teams include your primary Cardiologist (physician) and Advanced Practice Providers (APPs -  Physician Assistants and Nurse Practitioners) who all work together to provide you with the care you need, when you need it.  We recommend signing up for the patient portal called "MyChart".  Sign up information is provided on this After Visit Summary.  MyChart is used to connect with patients for Virtual Visits (Telemedicine).  Patients are able to view lab/test results, encounter notes, upcoming appointments, etc.  Non-urgent messages can be sent to your provider as well.   To learn more about what you can do with MyChart, go to [email or web address].    Your next appointment:   6 month(s)  The format for your next appointment:   In Person  Provider:   Berniece Salines, DO   Other Instructions NA     Adopting a Healthy Lifestyle.  Know what a healthy weight is for you (roughly BMI <25) and aim to maintain this   Aim for 7+ servings of fruits and vegetables daily   65-80+ fluid ounces of water or unsweet tea for healthy kidneys   Limit to max 1 drink of alcohol per day; avoid smoking/tobacco   Limit animal fats in diet for cholesterol and heart health - choose grass fed whenever available   Avoid highly processed foods, and foods high in saturated/trans fats   Aim for low stress - take time to unwind and care for your mental health   Aim for 150 min of moderate intensity exercise weekly for heart health, and weights twice weekly for bone health   Aim for 7-9 hours of sleep daily   When it comes to diets, agreement about the perfect plan isnt easy to find, even among the experts. Experts at the [employer] developed an idea known as the Healthy Eating Plate. Just imagine a plate divided into logical, healthy portions.   The emphasis is on diet quality:   Load up on vegetables and fruits - one-half of your plate:  Aim for color and variety, and remember that potatoes dont count.   Go for whole grains - one-quarter of your plate: Whole wheat, barley, wheat berries, quinoa, oats, brown rice, and foods made with them. If you want pasta, go with whole wheat pasta.   Protein power - one-quarter of your plate: Fish, chicken, beans, and nuts are all healthy, versatile protein sources. Limit red meat.   The diet, however, does go beyond the plate, offering a few other suggestions.   Use healthy plant oils, such as olive, canola, soy, corn, sunflower and peanut. Check the labels, and avoid partially hydrogenated oil, which have unhealthy trans fats.   If youre thirsty, drink water. Coffee and tea are good in moderation, but skip sugary drinks and limit milk and dairy products to one or two daily servings.   The type of carbohydrate in the diet is more important than the amount. Some sources of carbohydrates, such as vegetables, fruits, whole grains, and beans-are healthier than others.   Finally, stay active  Signed, Berniece Salines, DO  05/12/2020 8:58 AM    [facility]

## 2020-05-12 NOTE — Telephone Encounter (Signed)
Patient nursing home called and wanted to know if patient can cancel wound check appointment on 05/13/2020 since she saw another doctor who looked at her wound. Nursing home was told no she has to come in our office. They had more questions, call was transferred to triage

## 2020-05-12 NOTE — Patient Instructions (Signed)

## 2020-05-12 NOTE — Telephone Encounter (Signed)
[facility] facility staff informed it is necessary for the patient to attend wound care appointment on 05/13/20 and appointment with EP AP on 05/22/20.

## 2020-05-13 ENCOUNTER — Ambulatory Visit (INDEPENDENT_AMBULATORY_CARE_PROVIDER_SITE_OTHER): Payer: No Typology Code available for payment source | Admitting: Emergency Medicine

## 2020-05-13 DIAGNOSIS — I4821 Permanent atrial fibrillation: Secondary | ICD-10-CM

## 2020-05-13 LAB — CUP PACEART INCLINIC DEVICE CHECK
Battery Remaining Longevity: 139 mo
Battery Voltage: 3.21 V
Brady Statistic AP VP Percent: 0 %
Brady Statistic AP VS Percent: 0 %
Brady Statistic AS VP Percent: 91.37 %
Brady Statistic AS VS Percent: 8.63 %
Brady Statistic RA Percent Paced: 0 %
Brady Statistic RV Percent Paced: 91.37 %
Date Time Interrogation Session: 20210715142152
Implantable Lead Implant Date: 20210701
Implantable Lead Location: 753860
Implantable Lead Model: 3830
Implantable Pulse Generator Implant Date: 20210701
Lead Channel Impedance Value: 3401 Ohm
Lead Channel Impedance Value: 3401 Ohm
Lead Channel Impedance Value: 437 Ohm
Lead Channel Impedance Value: 570 Ohm
Lead Channel Pacing Threshold Amplitude: 0.5 V
Lead Channel Pacing Threshold Pulse Width: 0.4 ms
Lead Channel Sensing Intrinsic Amplitude: 3.375 mV
Lead Channel Setting Pacing Amplitude: 3.5 V
Lead Channel Setting Pacing Pulse Width: 0.4 ms
Lead Channel Setting Sensing Sensitivity: 1.2 mV

## 2020-05-13 NOTE — Progress Notes (Signed)
Wound check appointment. Steri-strips removed. Wound without redness or edema. Incision edges approximated, wound well healed. Normal device function. Thresholds, sensing, and impedances consistent with implant measurements. Device programmed at 3.5V/auto capture programmed on for extra safety margin until 3 month visit. Histogram distribution appropriate for patient and level of activity. No mode switches or high ventricular rates noted. LRL reprogrammed to 80 bpm, mode changed to VVIR. Patient educated about wound care, arm mobility, lifting restrictions. ROV in 3 months with implanting physician. Next home remote 07/30/20.

## 2020-05-21 ENCOUNTER — Encounter: Payer: No Typology Code available for payment source | Admitting: Physician Assistant

## 2020-05-27 ENCOUNTER — Ambulatory Visit: Payer: No Typology Code available for payment source

## 2020-05-30 DEATH — deceased

## 2020-06-03 ENCOUNTER — Ambulatory Visit: Payer: No Typology Code available for payment source | Admitting: Gastroenterology

## 2020-08-10 ENCOUNTER — Encounter: Payer: No Typology Code available for payment source | Admitting: Internal Medicine

## 2020-08-26 IMAGING — DX DG CHEST 1V PORT
1 series · 1 of 1 positions shown · non-contrast
Comparison: Chest radiograph 12/12/2019

CLINICAL DATA: SOB

EXAM:
PORTABLE CHEST 1 VIEW

[chest ap]
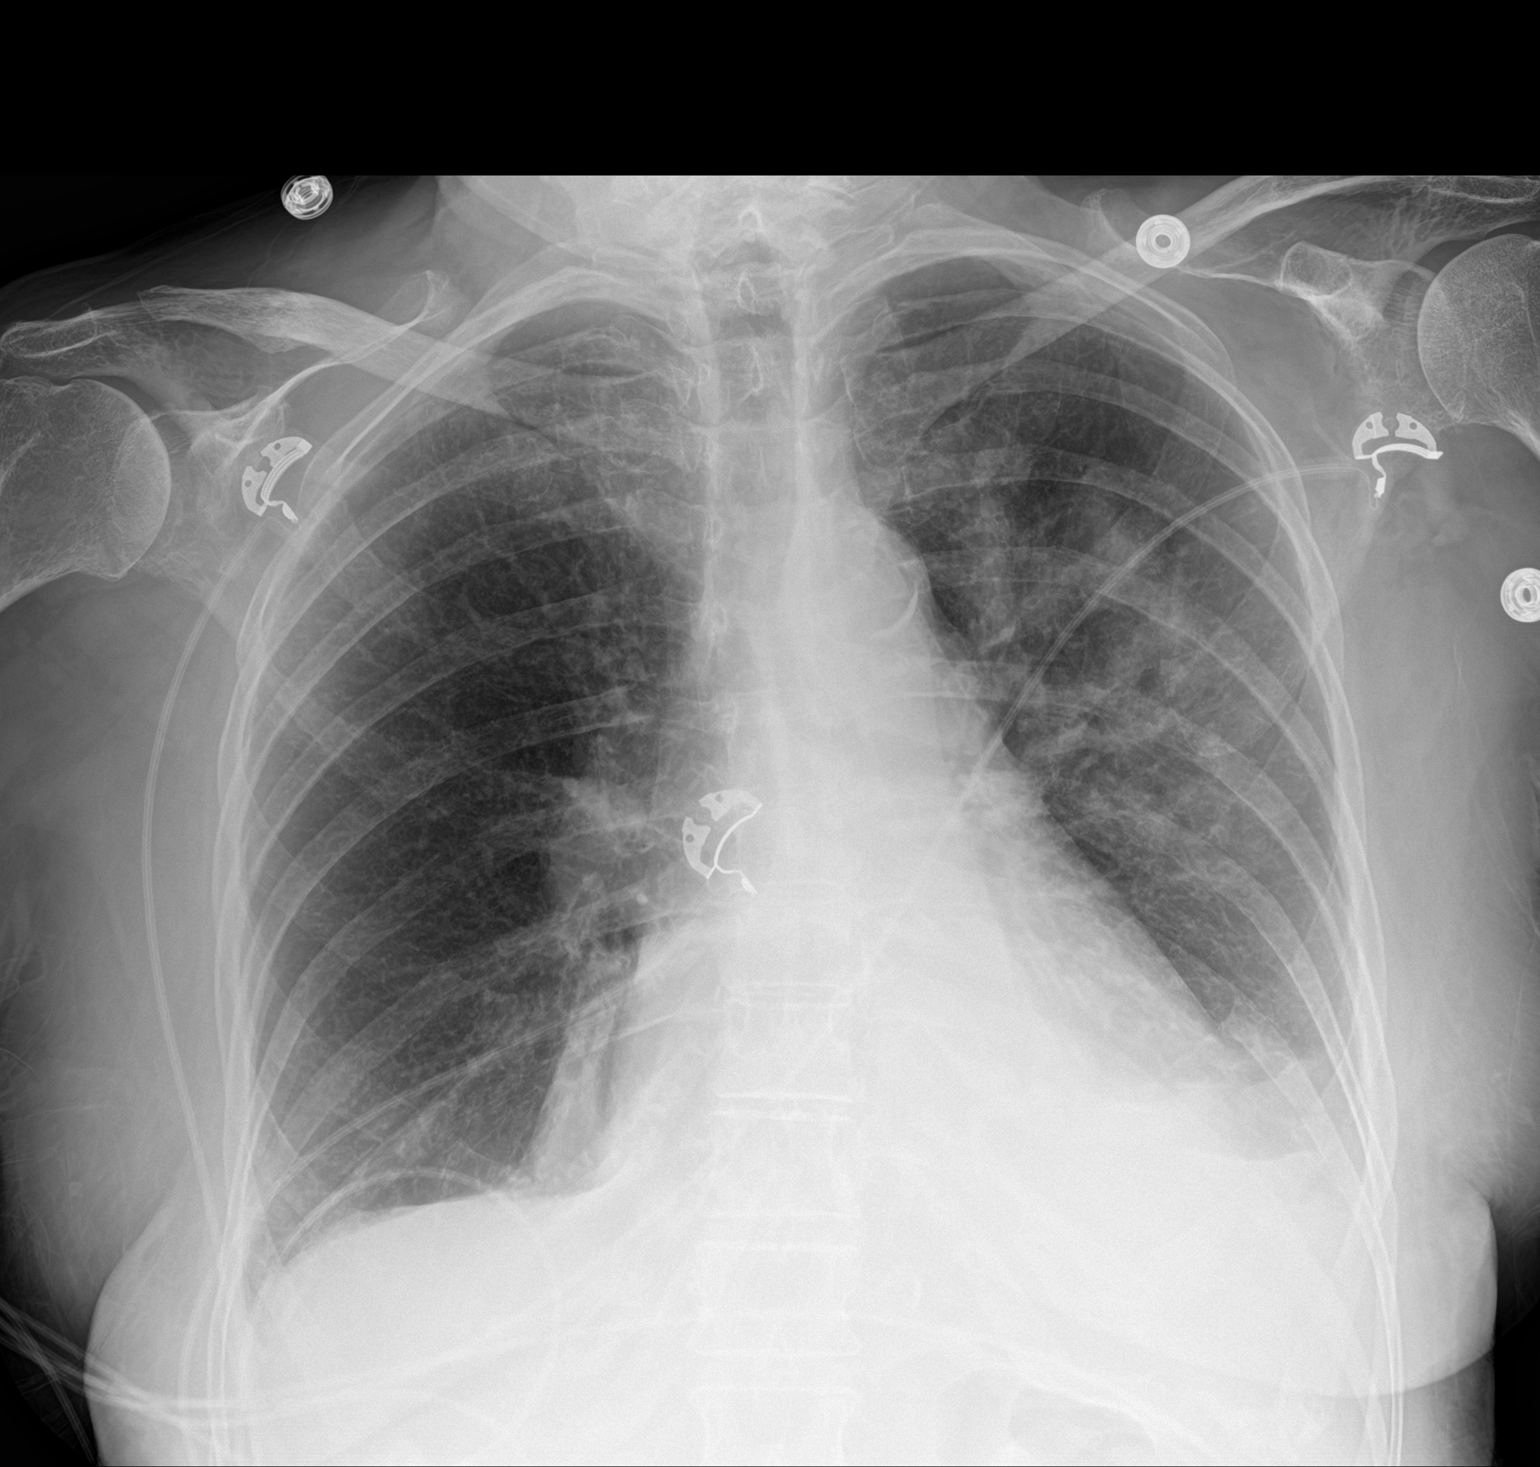

[1 of 1 positions shown; findings below may reference images not displayed]

FINDINGS: Stable cardiomediastinal contours. Aortic arch calcification. There
are new patchy airspace opacities in the left upper lung. There are
hazy opacities at the left lung base which may reflect a combination
of atelectasis and pleural fluid. There is a bandlike opacity in the
medial right lung base favored to represent atelectasis. No
pneumothorax. Possible small volume left pleural fluid. No acute
finding in the visualized skeleton.
IMPRESSION: 1. New patchy airspace opacities in the left upper lung raising
concern for infection.
2. Probable bibasilar atelectasis and left pleural effusion.

Radiographic follow-up is recommended in 3-4 weeks to ensure
resolution.

## 2020-09-29 IMAGING — DX DG CHEST 2V
2 series · 2 of 2 positions shown · non-contrast
Comparison: Chest x-ray 04/11/2020

CLINICAL DATA: Permanent pacemaker placement.

EXAM:
CHEST - 2 VIEW

[chest lat]
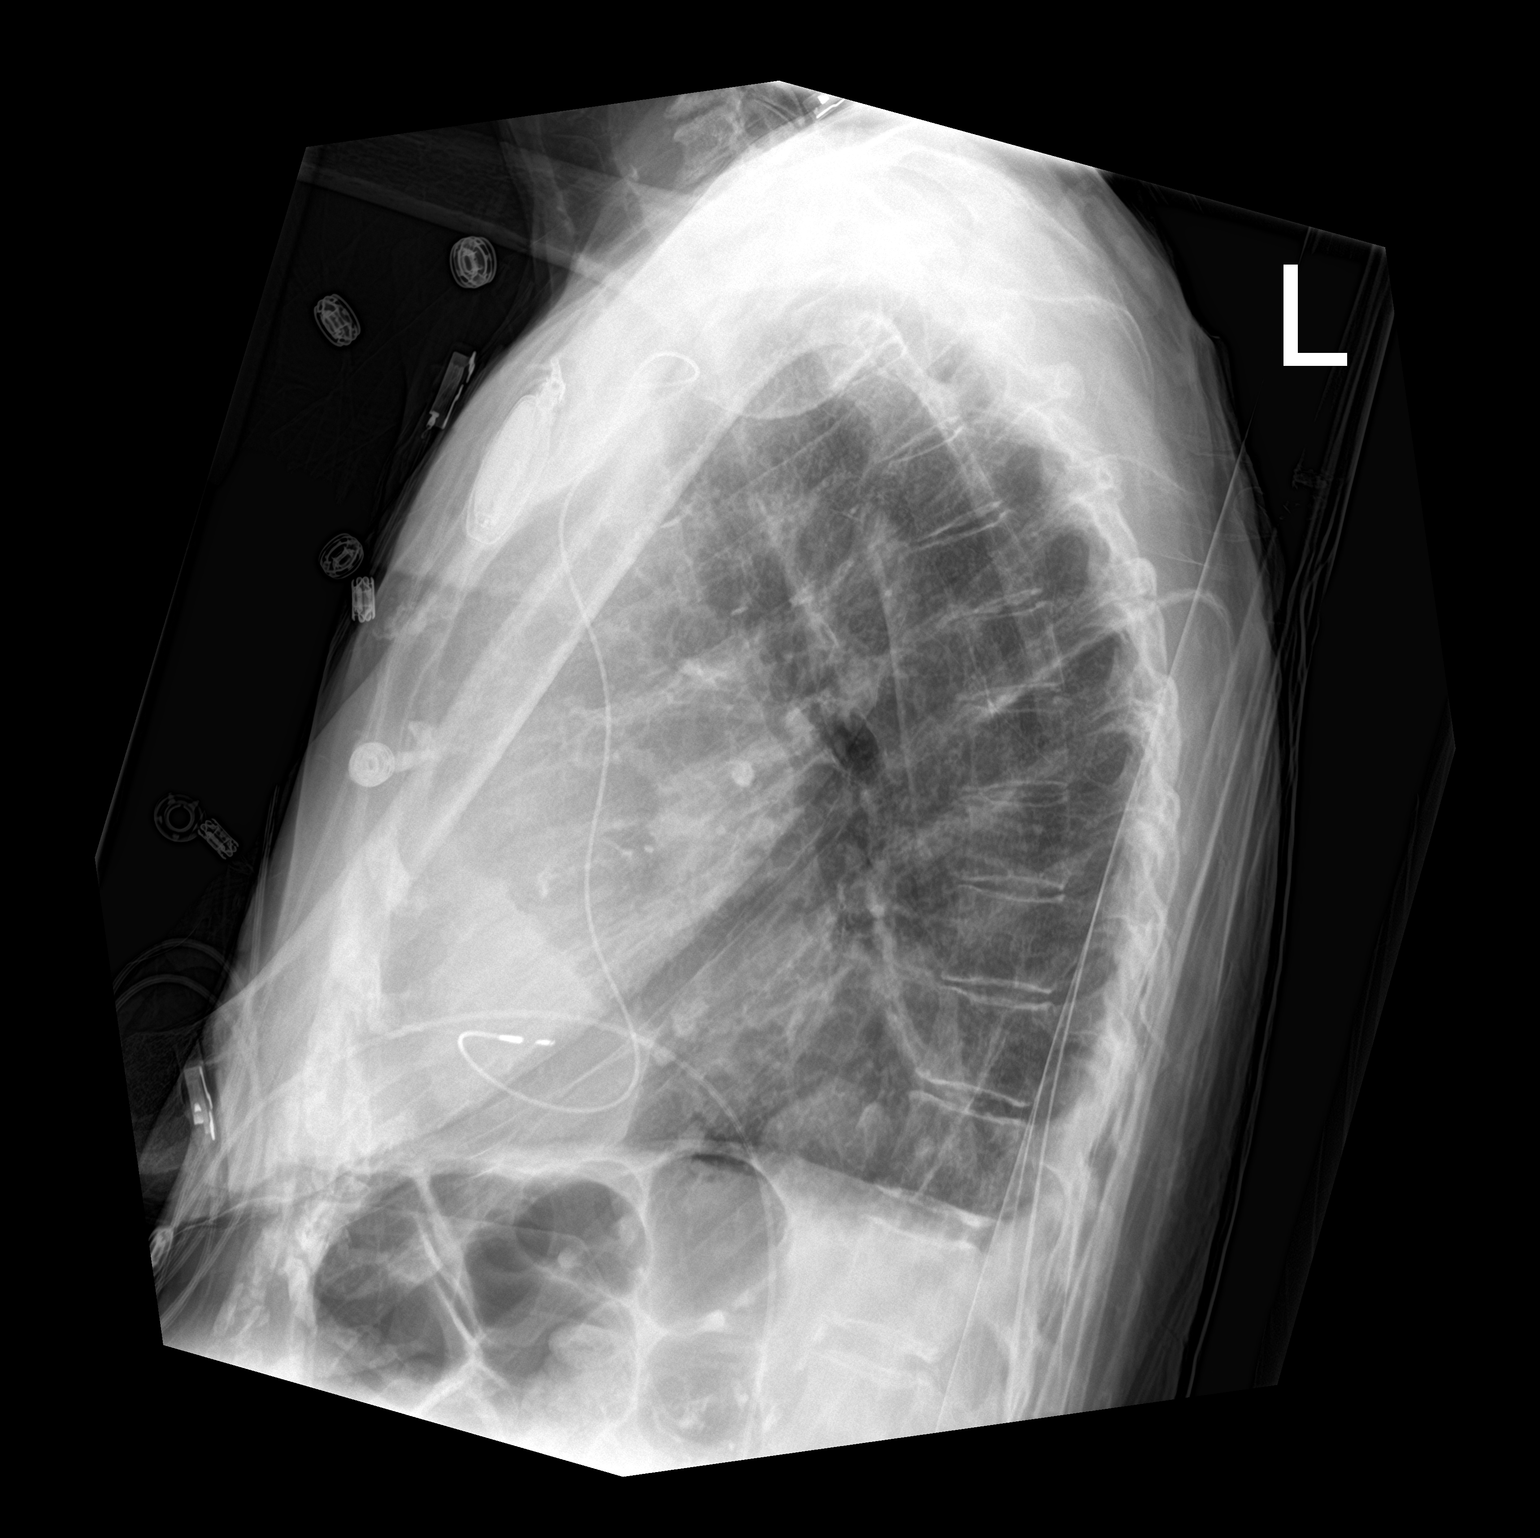

[chest ap]
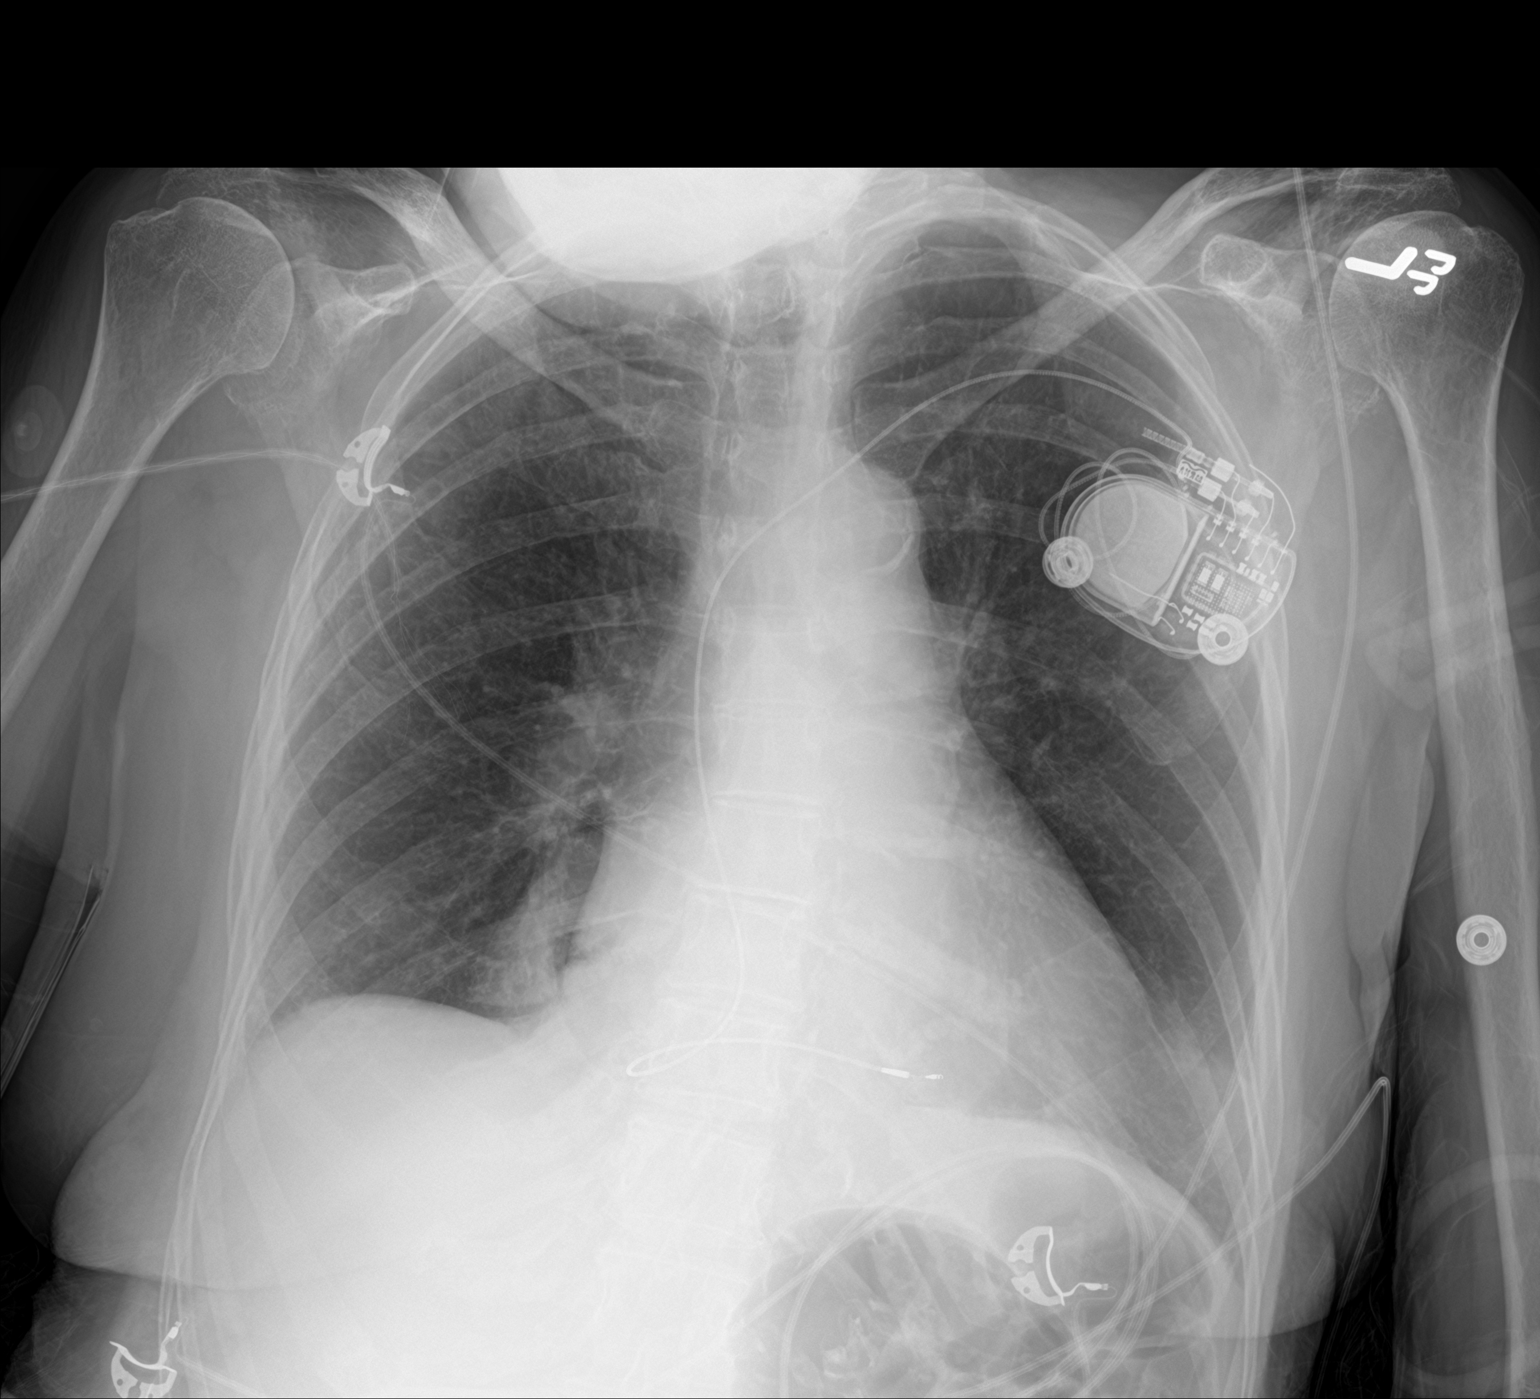

[2 of 2 positions shown; findings below may reference images not displayed]

FINDINGS: The heart is upper limits of normal in size given the AP projection.
Stable mild tortuosity and calcification of the thoracic aorta.

New permanent left-sided pacemaker with a single right ventricular
pacer wire. No complicating features are identified.

Streaky bibasilar atelectasis but no infiltrates or effusions.
IMPRESSION: 1. Pacer wire in good position without complicating features.
2. Streaky bibasilar atelectasis.
